# Patient Record
Sex: Male | Born: 1980
Health system: Southern US, Community
[De-identification: ages and names within clinical notes are randomized; demographics above are authoritative.]

## PROBLEM LIST (undated history)

## (undated) DIAGNOSIS — F17200 Nicotine dependence, unspecified, uncomplicated: Secondary | ICD-10-CM

## (undated) DIAGNOSIS — F1910 Other psychoactive substance abuse, uncomplicated: Secondary | ICD-10-CM

---

## 2014-10-04 ENCOUNTER — Emergency Department (HOSPITAL_COMMUNITY): Payer: Self-pay

## 2014-10-04 ENCOUNTER — Emergency Department (HOSPITAL_COMMUNITY)
Admission: EM | Admit: 2014-10-04 | Discharge: 2014-10-04 | Disposition: A | Discharge status: Home / Self Care | Payer: Self-pay | Attending: Emergency Medicine | Admitting: Emergency Medicine

## 2014-10-04 ENCOUNTER — Encounter (HOSPITAL_COMMUNITY): Payer: Self-pay | Admitting: Emergency Medicine

## 2014-10-04 DIAGNOSIS — Z720 Tobacco use: Secondary | ICD-10-CM | POA: Insufficient documentation

## 2014-10-04 DIAGNOSIS — M5136 Other intervertebral disc degeneration, lumbar region: Secondary | ICD-10-CM | POA: Insufficient documentation

## 2014-10-04 DIAGNOSIS — R000 Tachycardia, unspecified: Secondary | ICD-10-CM | POA: Insufficient documentation

## 2014-10-04 DIAGNOSIS — R112 Nausea with vomiting, unspecified: Secondary | ICD-10-CM | POA: Insufficient documentation

## 2014-10-04 DIAGNOSIS — L739 Follicular disorder, unspecified: Secondary | ICD-10-CM | POA: Insufficient documentation

## 2014-10-04 DIAGNOSIS — M545 Low back pain, unspecified: Secondary | ICD-10-CM

## 2014-10-04 DIAGNOSIS — R509 Fever, unspecified: Secondary | ICD-10-CM | POA: Insufficient documentation

## 2014-10-04 LAB — CBC WITH DIFFERENTIAL/PLATELET
Basophils Absolute: 0 10*3/uL (ref 0.0–0.1)
Basophils Relative: 0 % (ref 0–1)
Eosinophils Absolute: 0.3 10*3/uL (ref 0.0–0.7)
Eosinophils Relative: 5 % (ref 0–5)
HCT: 41.2 % (ref 39.0–52.0)
Hemoglobin: 14.5 g/dL (ref 13.0–17.0)
Lymphocytes Relative: 17 % (ref 12–46)
Lymphs Abs: 0.8 10*3/uL (ref 0.7–4.0)
MCH: 30.5 pg (ref 26.0–34.0)
MCHC: 35.2 g/dL (ref 30.0–36.0)
MCV: 86.7 fL (ref 78.0–100.0)
Monocytes Absolute: 0.1 10*3/uL (ref 0.1–1.0)
Monocytes Relative: 2 % — ABNORMAL LOW (ref 3–12)
Neutro Abs: 3.7 10*3/uL (ref 1.7–7.7)
Neutrophils Relative %: 76 % (ref 43–77)
Platelets: 160 10*3/uL (ref 150–400)
RBC: 4.75 MIL/uL (ref 4.22–5.81)
RDW: 12.5 % (ref 11.5–15.5)
WBC: 5 10*3/uL (ref 4.0–10.5)

## 2014-10-04 LAB — URINALYSIS, ROUTINE W REFLEX MICROSCOPIC
Bilirubin Urine: NEGATIVE
Glucose, UA: NEGATIVE mg/dL
Hgb urine dipstick: NEGATIVE
Ketones, ur: NEGATIVE mg/dL
Leukocytes, UA: NEGATIVE
Nitrite: NEGATIVE
Protein, ur: NEGATIVE mg/dL
Specific Gravity, Urine: 1.016 (ref 1.005–1.030)
Urobilinogen, UA: 0.2 mg/dL (ref 0.0–1.0)
pH: 7 (ref 5.0–8.0)

## 2014-10-04 LAB — COMPREHENSIVE METABOLIC PANEL
ALT: 98 U/L — ABNORMAL HIGH (ref 17–63)
AST: 33 U/L (ref 15–41)
Albumin: 4.7 g/dL (ref 3.5–5.0)
Alkaline Phosphatase: 60 U/L (ref 38–126)
Anion gap: 11 (ref 5–15)
BUN: 13 mg/dL (ref 6–20)
CO2: 25 mmol/L (ref 22–32)
Calcium: 9.2 mg/dL (ref 8.9–10.3)
Chloride: 100 mmol/L — ABNORMAL LOW (ref 101–111)
Creatinine, Ser: 0.78 mg/dL (ref 0.61–1.24)
GFR calc Af Amer: >60 mL/min (ref >60)
GFR calc non Af Amer: >60 mL/min (ref >60)
Glucose, Bld: 131 mg/dL — ABNORMAL HIGH (ref 65–99)
Potassium: 3.8 mmol/L (ref 3.5–5.1)
Sodium: 136 mmol/L (ref 135–145)
Total Bilirubin: 1.3 mg/dL — ABNORMAL HIGH (ref 0.3–1.2)
Total Protein: 7.7 g/dL (ref 6.5–8.1)

## 2014-10-04 LAB — C-REACTIVE PROTEIN: CRP: 2.4 mg/dL — ABNORMAL HIGH (ref <1.0)

## 2014-10-04 LAB — LACTIC ACID, PLASMA: Lactic Acid, Venous: 1 mmol/L (ref 0.5–2.0)

## 2014-10-04 LAB — SEDIMENTATION RATE: Sed Rate: 10 mm/hr (ref 0–16)

## 2014-10-04 MED ORDER — MORPHINE SULFATE 4 MG/ML IJ SOLN
6.0000 mg | Freq: Once | INTRAMUSCULAR | Status: AC
  Administered 2014-10-04: 6 mg via INTRAVENOUS
  Filled 2014-10-04: qty 2

## 2014-10-04 MED ORDER — SODIUM CHLORIDE 0.9 % IV BOLUS (SEPSIS)
1000.0000 mL | Freq: Once | INTRAVENOUS | Status: AC
Start: 2014-10-04 — End: 2014-10-04
  Administered 2014-10-04: 1000 mL via INTRAVENOUS

## 2014-10-04 MED ORDER — IOHEXOL 300 MG/ML  SOLN
100.0000 mL | Freq: Once | INTRAMUSCULAR | Status: AC | PRN
  Administered 2014-10-04: 100 mL via INTRAVENOUS

## 2014-10-04 MED ORDER — IOHEXOL 300 MG/ML  SOLN
25.0000 mL | Freq: Once | INTRAMUSCULAR | Status: AC | PRN
  Administered 2014-10-04: 25 mL via ORAL

## 2014-10-04 MED ORDER — SODIUM CHLORIDE 0.9 % IV BOLUS (SEPSIS)
1000.0000 mL | Freq: Once | INTRAVENOUS | Status: AC
  Administered 2014-10-04: 1000 mL via INTRAVENOUS

## 2014-10-04 MED ORDER — GADOBENATE DIMEGLUMINE 529 MG/ML IV SOLN
20.0000 mL | Freq: Once | INTRAVENOUS | Status: AC | PRN
  Administered 2014-10-04: 18 mL via INTRAVENOUS

## 2014-10-04 MED ORDER — ACETAMINOPHEN 325 MG PO TABS
650.0000 mg | ORAL_TABLET | Freq: Once | ORAL | Status: AC
  Administered 2014-10-04: 650 mg via ORAL
  Filled 2014-10-04: qty 2

## 2014-10-04 MED ORDER — ONDANSETRON HCL 4 MG/2ML IJ SOLN
4.0000 mg | Freq: Once | INTRAMUSCULAR | Status: AC
  Administered 2014-10-04: 4 mg via INTRAVENOUS
  Filled 2014-10-04: qty 2

## 2014-10-04 NOTE — ED Notes (Signed)
EKG given to EDP,Kohut,MD., for review. 

## 2014-10-04 NOTE — ED Notes (Signed)
Pt presents POV for lower back pain, abdominal pain, and emesis since 1100 this afternoon.

## 2014-10-04 NOTE — ED Notes (Signed)
Patient transported to MRI 

## 2014-10-04 NOTE — ED Provider Notes (Signed)
Pt signed out by Dr Juleen ChinaKohut at 0730 that mri is pending, and if neg for acute infectious process, to d/c to home.  MRI results noted. Discussed w pt.  Pt with ddd, but no evidence infection.   Pt states he has had similar lower back pain in past. He has no radicular pain. Low back pain currently is much improved/resolved.   Spine non tender. Back exam otherwise unremarkable as well.   Pt denies fever, chills or sweats (intial temp was 100.7).  Pt states he does not feel ill or sick in any way. No headaches. No neck pain or stiffness. No cough or uri c/o. No chest pain or sob. No abd pain. abd soft nt. No nvd. No dysuria or gu c/o. No rash.   Pt tolerating po fluids. Is ambulatory about the ED without any faintness or dizziness. When pt resting/sleeping, several low bps noted during early AM hours, earlier today - pt denies at any point feel faint, or ill.  Lactate normal.   Manual bps rechecked prior to d/c this AM, and in range 96-100/55-60.   Hr has remained in 60's. Pulse ox 99%. rr 14 w no increased wob.  Pt remains asymptomatic, and feels well.  Pt currently appears stable for d/c.  Return precautions discussed.    Cathren LaineKevin Karam Dunson, MD 10/04/14 [id number]

## 2014-10-04 NOTE — ED Notes (Signed)
Pt informed that a urine specimen is needed. 

## 2014-10-04 NOTE — ED Notes (Signed)
MD made aware of pt's low BP and HR.

## 2014-10-04 NOTE — ED Provider Notes (Signed)
CSN: [id number]     Arrival date & time 10/04/14  0043 History   First MD Initiated Contact with Patient 10/04/14 0057     Chief Complaint  Patient presents with  . Back Pain  . Nausea  . Emesis     (Consider location/radiation/quality/duration/timing/severity/associated sxs/prior Treatment) HPI   34 year old male with acute onset of midline lumbar pain while at rest. Symptom onset around 2200 yesterday. Persistent since then. Patient describes the pain as sharp and severe. Worse with movement. He felt fine yesterday up until the onset of symptoms. No urinary complaints. No abdominal pain. No rectal pain. Nausea and vomited prior to arrival. No diarrhea. Denies any history of significant lower back pain. Denies any previous back surgeries. Denies history of IV drug use. Fever and has felt chills.  History reviewed. No pertinent past medical history. History reviewed. No pertinent past surgical history. No family history on file. History  Substance Use Topics  . Smoking status: Current Every Day Smoker -- 1.00 packs/day    Types: Cigarettes  . Smokeless tobacco: Not on file  . Alcohol Use: Not on file    Review of Systems  All systems reviewed and negative, other than as noted in HPI.   Allergies  Review of patient's allergies indicates not on file.  Home Medications   Prior to Admission medications   Not on File   BP 101/57 mmHg  Pulse 144  Temp(Src) 100.7 F (38.2 C) (Oral)  Resp 21  SpO2 94% Physical Exam  Constitutional: He appears well-developed and well-nourished.  Laying in bed. Appears uncomfortable, but not toxic.  HENT:  Head: Normocephalic and atraumatic.  Eyes: Conjunctivae are normal. Right eye exhibits no discharge. Left eye exhibits no discharge.  Neck: Neck supple.  Cardiovascular: Regular rhythm and normal heart sounds.  Exam reveals no gallop and no friction rub.   No murmur heard. Tachycardic  Pulmonary/Chest: Effort normal and breath sounds  normal. No respiratory distress.  Abdominal: Soft. He exhibits no distension. There is no tenderness.  Genitourinary:  DRE: normal tone. Prostate does not feel significantly enlarged nor tender  Musculoskeletal: He exhibits no edema or tenderness.  Scattered folliculitis to the back, but no concerning lesions noted otherwise. Patient points to midline mid to lower lumbar region as area of maximal pain. Not reproducible with palpation.  Neurological: He is alert.  Strength 5/5 b/l LE. Sensation intact to light touch.   Skin: Skin is warm and dry.  Psychiatric: He has a normal mood and affect. His behavior is normal. Thought content normal.  Nursing note and vitals reviewed.   ED Course  Procedures (including critical care time) Labs Review Labs Reviewed  CBC WITH DIFFERENTIAL/PLATELET - Abnormal; Notable for the following:    Monocytes Relative 2 (*)    All other components within normal limits  COMPREHENSIVE METABOLIC PANEL - Abnormal; Notable for the following:    Chloride 100 (*)    Glucose, Bld 131 (*)    ALT 98 (*)    Total Bilirubin 1.3 (*)    All other components within normal limits  CULTURE, BLOOD (ROUTINE X 2)  CULTURE, BLOOD (ROUTINE X 2)  LACTIC ACID, PLASMA  URINALYSIS, ROUTINE W REFLEX MICROSCOPIC (NOT AT [facility])  SEDIMENTATION RATE  C-REACTIVE PROTEIN    Imaging Review Dg Chest 2 View  10/04/2014   CLINICAL DATA:  Fever and vomiting for 24 hours  EXAM: CHEST  2 VIEW  COMPARISON:  None.  FINDINGS: The heart size and mediastinal contours  are within normal limits. Both lungs are clear. The visualized skeletal structures are unremarkable.  IMPRESSION: No active cardiopulmonary disease.   Electronically Signed   By: Ellery Plunk M.D.   On: 10/04/2014 03:33   Ct Abdomen Pelvis W Contrast  10/04/2014   CLINICAL DATA:  Lower back pain, abdominal pain and emesis since 11:00.  EXAM: CT ABDOMEN AND PELVIS WITH CONTRAST  TECHNIQUE: Multidetector CT imaging of the abdomen  and pelvis was performed using the standard protocol following bolus administration of intravenous contrast.  CONTRAST:  OMNIPAQUE IOHEXOL 300 MG/ML  SOLN  COMPARISON:  None.  FINDINGS: There are normal appearances of the liver, spleen, pancreas, adrenals and kidneys except for a 9 mm presumed cysts in the lower pole right kidney.  Bowel and mesentery appear unremarkable.  No acute inflammatory changes are evident in the abdomen or pelvis. There is no ascites.  There is no significant abnormality in the lower chest.  There is no significant musculoskeletal abnormality. Incidental note is made of a small fat containing umbilical hernia.  IMPRESSION: No significant abnormality.  Small fat containing umbilical hernia.   Electronically Signed   By: Ellery Plunk M.D.   On: 10/04/2014 03:03     EKG Interpretation   Date/Time:  Thursday October 04 2014 00:59:15 EDT Ventricular Rate:  139 PR Interval:  154 QRS Duration: 95 QT Interval:  275 QTC Calculation: 418 R Axis:   96 Text Interpretation:  Sinus tachycardia anterior STE, likely early  repolarization No old tracing to compare Confirmed by Jameelah Watts  MD, Abdoulie Tierce  ([id number]) on 10/04/2014 1:02:27 AM      MDM   Final diagnoses:  Fever  Lower back pain    33yM with acute onset of severe midline lumbar pain. Febrile. No neuro complaints. Nonfocal neuro exam. No urinary complaints. No rectal pain. No abdominal pain.   Lower than typically see with pyelonephritis. No CVA tenderness. Consider SEA or vertebral osteomyelitis/discitis. Pretty acute onset though. No obvious risk factors that I could identify. Otherwise fairly healthy. No previous back surgeries/procedures. Denies hx of IVDU.  Consider prostatitis. No rectal pain. Rectal exam unremarkable. Unfortunately no MRI available at this time. Will start with CT. May need MRI if no obvious explanation for his symptoms.   Work-up pretty unremarkable at this point. Looks significantly better. HR  now 50-60s from 140s on arrival. Still having lower back pain but much improved. Hypotension now but normal HR. No new complaints. Denies dizziness/lightheadedness/SOB. Lactic acid normal. Sed Rate came back normal. Since he is still having lower back pan with documented fever w/o reasonable alternative explanation, I feel he still need MR. I feel appropriate for discharge if MRI doesn't reveal concerning pathology.   Raeford Razor, MD 10/04/14 [phone]

## 2014-10-04 NOTE — ED Notes (Signed)
Pt ambulated in the hallway without complaint of dizziness or lightheadedness.  MD made aware.

## 2014-10-04 NOTE — ED Notes (Signed)
MRI will be over to get pt in @ 45 min. Pt states he is not claustrophobic, no body piercing, only metal in body is in fillings. Pt is A & O. NAD, denies pain at this time

## 2014-10-04 NOTE — Discharge Instructions (Signed)
It was our pleasure to provide your ER care today - we hope that you feel better.  Rest. Drink adequate fluids.    Your MRI was read as follows: IMPRESSION: 1. 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 2. Right paramedian disc protrusion with moderate right and mild left subarticular stenosis at L4-5. 3. Mild right foraminal narrowing at L4-5.   Avoid bending at waist, or heavy lifting > 20 lbs for the next few days.  Take motrin or aleve as need for pain.  Follow up with primary care doctor in the coming week for recheck if symptoms fail to improve/resolve.  Return to ER right away if feeling worse, weak/faint, high fevers, trouble breathing, severe abdominal pain, other new symptoms, medical emergency, other concern.     Fever, Adult A fever is a higher than normal body temperature. In an adult, an oral temperature around 98.6 F (37 C) is considered normal. A temperature of 100.4 F (38 C) or higher is generally considered a fever. Mild or moderate fevers generally have no long-term effects and often do not require treatment. Extreme fever (greater than or equal to 106 F or 41.1 C) can cause seizures. The sweating that may occur with repeated or prolonged fever may cause dehydration. Elderly people can develop confusion during a fever. A measured temperature can vary with:  Age.  Time of day.  Method of measurement (mouth, underarm, rectal, or ear). The fever is confirmed by taking a temperature with a thermometer. Temperatures can be taken different ways. Some methods are accurate and some are not.  An oral temperature is used most commonly. Electronic thermometers are fast and accurate.  An ear temperature will only be accurate if the thermometer is positioned as recommended by the manufacturer.  A rectal temperature is accurate and done for those adults who have a condition where an oral temperature cannot be taken.  An  underarm (axillary) temperature is not accurate and not recommended. Fever is a symptom, not a disease.  CAUSES   Infections commonly cause fever.  Some noninfectious causes for fever include:  Some arthritis conditions.  Some thyroid or adrenal gland conditions.  Some immune system conditions.  Some types of cancer.  A medicine reaction.  High doses of certain street drugs such as methamphetamine.  Dehydration.  Exposure to high outside or room temperatures.  Occasionally, the source of a fever cannot be determined. This is sometimes called a "fever of unknown origin" (FUO).  Some situations may lead to a temporary rise in body temperature that may go away on its own. Examples are:  Childbirth.  Surgery.  Intense exercise. HOME CARE INSTRUCTIONS   Take appropriate medicines for fever. Follow dosing instructions carefully. If you use acetaminophen to reduce the fever, be careful to avoid taking other medicines that also contain acetaminophen. Do not take aspirin for a fever if you are younger than age 51. There is an association with Reye's syndrome. Reye's syndrome is a rare but potentially deadly disease.  If an infection is present and antibiotics have been prescribed, take them as directed. Finish them even if you start to feel better.  Rest as needed.  Maintain an adequate fluid intake. To prevent dehydration during an illness with prolonged or recurrent fever, you may need to drink extra fluid.Drink enough fluids to keep your urine clear or pale yellow.  Sponging or bathing with room temperature water may help reduce body temperature. Do not use ice water or alcohol sponge  baths.  Dress comfortably, but do not over-bundle. SEEK MEDICAL CARE IF:   You are unable to keep fluids down.  You develop vomiting or diarrhea.  You are not feeling at least partly better after 3 days.  You develop new symptoms or problems. SEEK IMMEDIATE MEDICAL CARE IF:   You have  shortness of breath or trouble breathing.  You develop excessive weakness.  You are dizzy or you faint.  You are extremely thirsty or you are making little or no urine.  You develop new pain that was not there before (such as in the head, neck, chest, back, or abdomen).  You have persistent vomiting and diarrhea for more than 1 to 2 days.  You develop a stiff neck or your eyes become sensitive to light.  You develop a skin rash.  You have a fever or persistent symptoms for more than 2 to 3 days.  You have a fever and your symptoms suddenly get worse. MAKE SURE YOU:   Understand these instructions.  Will watch your condition.  Will get help right away if you are not doing well or get worse. Document Released: 09/16/2000 Document Revised: 08/07/2013 Document Reviewed: 01/22/2011 [facility] Patient Information 2015 [place], [place]. This information is not intended to replace advice given to you by your health care provider. Make sure you discuss any questions you have with your health care provider.    Degenerative Disk Disease Degenerative disk disease is a condition caused by the changes that occur in the cushions of the backbone (spinal disks) as you grow older. Spinal disks are soft and compressible disks located between the bones of the spine (vertebrae). They act like shock absorbers. Degenerative disk disease can affect the whole spine. However, the neck and lower back are most commonly affected. Many changes can occur in the spinal disks with aging, such as:  The spinal disks may dry and shrink.  Small tears may occur in the tough, outer covering of the disk (annulus).  The disk space may become smaller due to loss of water.  Abnormal growths in the bone (spurs) may occur. This can put pressure on the nerve roots exiting the spinal canal, causing pain.  The spinal canal may become narrowed. CAUSES  Degenerative disk disease is a condition caused by the changes that  occur in the spinal disks with aging. The exact cause is not known, but there is a genetic basis for many patients. Degenerative changes can occur due to loss of fluid in the disk. This makes the disk thinner and reduces the space between the backbones. Small cracks can develop in the outer layer of the disk. This can lead to the breakdown of the disk. You are more likely to get degenerative disk disease if you are overweight. Smoking cigarettes and doing heavy work such as weightlifting can also increase your risk of this condition. Degenerative changes can start after a sudden injury. Growth of bone spurs can compress the nerve roots and cause pain.  SYMPTOMS  The symptoms vary from person to person. Some people may have no pain, while others have severe pain. The pain may be so severe that it can limit your activities. The location of the pain depends on the part of your backbone that is affected. You will have neck or arm pain if a disk in the neck area is affected. You will have pain in your back, buttocks, or legs if a disk in the lower back is affected. The pain becomes worse while  bending, reaching up, or with twisting movements. The pain may start gradually and then get worse as time passes. It may also start after a major or minor injury. You may feel numbness or tingling in the arms or legs.  DIAGNOSIS  Your caregiver will ask you about your symptoms and about activities or habits that may cause the pain. He or she may also ask about any injuries, diseases, or treatments you have had earlier. Your caregiver will examine you to check for the range of movement that is possible in the affected area, to check for strength in your extremities, and to check for sensation in the areas of the arms and legs supplied by different nerve roots. An X-ray of the spine may be taken. Your caregiver may suggest other imaging tests, such as magnetic resonance imaging (MRI), if needed.  TREATMENT  Treatment includes  rest, modifying your activities, and applying ice and heat. Your caregiver may prescribe medicines to reduce your pain and may ask you to do some exercises to strengthen your back. In some cases, you may need surgery. You and your caregiver will decide on the treatment that is best for you. HOME CARE INSTRUCTIONS   Follow proper lifting and walking techniques as advised by your caregiver.  Maintain good posture.  Exercise regularly as advised.  Perform relaxation exercises.  Change your sitting, standing, and sleeping habits as advised. Change positions frequently.  Lose weight as advised.  Stop smoking if you smoke.  Wear supportive footwear. SEEK MEDICAL CARE IF:  Your pain does not go away within 1 to 4 weeks. SEEK IMMEDIATE MEDICAL CARE IF:   Your pain is severe.  You notice weakness in your arms, hands, or legs.  You begin to lose control of your bladder or bowel movements. MAKE SURE YOU:   Understand these instructions.  Will watch your condition.  Will get help right away if you are not doing well or get worse. Document Released: 01/18/2007 Document Revised: 06/15/2011 Document Reviewed: 07/25/2013 [facility] Patient Information 2015 [place], [place]. This information is not intended to replace advice given to you by your health care provider. Make sure you discuss any questions you have with your health care provider.    Back Pain, Adult Low back pain is very common. About 1 in 5 people have back pain.The cause of low back pain is rarely dangerous. The pain often gets better over time.About half of people with a sudden onset of back pain feel better in just 2 weeks. About 8 in 10 people feel better by 6 weeks.  CAUSES Some common causes of back pain include:  Strain of the muscles or ligaments supporting the spine.  Wear and tear (degeneration) of the spinal discs.  Arthritis.  Direct injury to the back. DIAGNOSIS Most of the time, the direct cause of low  back pain is not known.However, back pain can be treated effectively even when the exact cause of the pain is unknown.Answering your caregiver's questions about your overall health and symptoms is one of the most accurate ways to make sure the cause of your pain is not dangerous. If your caregiver needs more information, he or she may order lab work or imaging tests (X-rays or MRIs).However, even if imaging tests show changes in your back, this usually does not require surgery. HOME CARE INSTRUCTIONS For many people, back pain returns.Since low back pain is rarely dangerous, it is often a condition that people can learn to [facility] their own.   Remain  active. It is stressful on the back to sit or stand in one place. Do not sit, drive, or stand in one place for more than 30 minutes at a time. Take short walks on level surfaces as soon as pain allows.Try to increase the length of time you walk each day.  Do not stay in bed.Resting more than 1 or 2 days can delay your recovery.  Do not avoid exercise or work.Your body is made to move.It is not dangerous to be active, even though your back may hurt.Your back will likely heal faster if you return to being active before your pain is gone.  Pay attention to your body when you bend and lift. Many people have less discomfortwhen lifting if they bend their knees, keep the load close to their bodies,and avoid twisting. Often, the most comfortable positions are those that put less stress on your recovering back.  Find a comfortable position to sleep. Use a firm mattress and lie on your side with your knees slightly bent. If you lie on your back, put a pillow under your knees.  Only take over-the-counter or prescription medicines as directed by your caregiver. Over-the-counter medicines to reduce pain and inflammation are often the most helpful.Your caregiver may prescribe muscle relaxant drugs.These medicines help dull your pain so you can more  quickly return to your normal activities and healthy exercise.  Put ice on the injured area.  Put ice in a plastic bag.  Place a towel between your skin and the bag.  Leave the ice on for 15-20 minutes, 03-04 times a day for the first 2 to 3 days. After that, ice and heat may be alternated to reduce pain and spasms.  Ask your caregiver about trying back exercises and gentle massage. This may be of some benefit.  Avoid feeling anxious or stressed.Stress increases muscle tension and can worsen back pain.It is important to recognize when you are anxious or stressed and learn ways to manage it.Exercise is a great option. SEEK MEDICAL CARE IF:  You have pain that is not relieved with rest or medicine.  You have pain that does not improve in 1 week.  You have new symptoms.  You are generally not feeling well. SEEK IMMEDIATE MEDICAL CARE IF:   You have pain that radiates from your back into your legs.  You develop new bowel or bladder control problems.  You have unusual weakness or numbness in your arms or legs.  You develop nausea or vomiting.  You develop abdominal pain.  You feel faint. Document Released: 03/23/2005 Document Revised: 09/22/2011 Document Reviewed: 07/25/2013 [facility] Patient Information 2015 [place], [place]. This information is not intended to replace advice given to you by your health care provider. Make sure you discuss any questions you have with your health care provider.

## 2014-10-09 LAB — CULTURE, BLOOD (ROUTINE X 2)
Culture: NO GROWTH
Culture: NO GROWTH

## 2016-01-02 ENCOUNTER — Emergency Department (HOSPITAL_COMMUNITY): Payer: Self-pay

## 2016-01-02 ENCOUNTER — Inpatient Hospital Stay (HOSPITAL_COMMUNITY)
Admission: EM | Admit: 2016-01-02 | Discharge: 2016-01-06 | DRG: 101 | Disposition: A | Discharge status: Home / Self Care | Payer: Self-pay | Attending: Internal Medicine | Admitting: Internal Medicine

## 2016-01-02 ENCOUNTER — Encounter (HOSPITAL_COMMUNITY): Payer: Self-pay | Admitting: Emergency Medicine

## 2016-01-02 DIAGNOSIS — R748 Abnormal levels of other serum enzymes: Secondary | ICD-10-CM | POA: Diagnosis present

## 2016-01-02 DIAGNOSIS — R740 Nonspecific elevation of levels of transaminase and lactic acid dehydrogenase [LDH]: Secondary | ICD-10-CM | POA: Diagnosis present

## 2016-01-02 DIAGNOSIS — D72829 Elevated white blood cell count, unspecified: Secondary | ICD-10-CM | POA: Diagnosis present

## 2016-01-02 DIAGNOSIS — R945 Abnormal results of liver function studies: Secondary | ICD-10-CM

## 2016-01-02 DIAGNOSIS — F1910 Other psychoactive substance abuse, uncomplicated: Secondary | ICD-10-CM | POA: Diagnosis present

## 2016-01-02 DIAGNOSIS — G9340 Encephalopathy, unspecified: Secondary | ICD-10-CM

## 2016-01-02 DIAGNOSIS — R569 Unspecified convulsions: Principal | ICD-10-CM | POA: Diagnosis present

## 2016-01-02 DIAGNOSIS — F1410 Cocaine abuse, uncomplicated: Secondary | ICD-10-CM | POA: Diagnosis present

## 2016-01-02 DIAGNOSIS — R411 Anterograde amnesia: Secondary | ICD-10-CM | POA: Diagnosis present

## 2016-01-02 DIAGNOSIS — R404 Transient alteration of awareness: Secondary | ICD-10-CM

## 2016-01-02 DIAGNOSIS — F17200 Nicotine dependence, unspecified, uncomplicated: Secondary | ICD-10-CM | POA: Diagnosis present

## 2016-01-02 DIAGNOSIS — R413 Other amnesia: Secondary | ICD-10-CM | POA: Diagnosis present

## 2016-01-02 DIAGNOSIS — R7989 Other specified abnormal findings of blood chemistry: Secondary | ICD-10-CM

## 2016-01-02 DIAGNOSIS — F1110 Opioid abuse, uncomplicated: Secondary | ICD-10-CM | POA: Diagnosis present

## 2016-01-02 DIAGNOSIS — F17210 Nicotine dependence, cigarettes, uncomplicated: Secondary | ICD-10-CM | POA: Diagnosis present

## 2016-01-02 HISTORY — DX: Other psychoactive substance abuse, uncomplicated: F19.10

## 2016-01-02 HISTORY — DX: Nicotine dependence, unspecified, uncomplicated: F17.200

## 2016-01-02 LAB — PROTIME-INR
INR: 1.03
PROTHROMBIN TIME: 13.5 s (ref 11.4–15.2)

## 2016-01-02 LAB — URINALYSIS, ROUTINE W REFLEX MICROSCOPIC
BILIRUBIN URINE: NEGATIVE
Glucose, UA: 250 mg/dL — AB
Hgb urine dipstick: NEGATIVE
Ketones, ur: NEGATIVE mg/dL
LEUKOCYTES UA: NEGATIVE
Nitrite: NEGATIVE
PH: 6 (ref 5.0–8.0)
Protein, ur: 30 mg/dL — AB
Specific Gravity, Urine: 1.02 (ref 1.005–1.030)

## 2016-01-02 LAB — CBC WITH DIFFERENTIAL/PLATELET
BASOS PCT: 0 %
Basophils Absolute: 0 10*3/uL (ref 0.0–0.1)
EOS ABS: 0 10*3/uL (ref 0.0–0.7)
Eosinophils Relative: 0 %
HCT: 38.9 % — ABNORMAL LOW (ref 39.0–52.0)
Hemoglobin: 13.7 g/dL (ref 13.0–17.0)
Lymphocytes Relative: 17 %
Lymphs Abs: 2.2 10*3/uL (ref 0.7–4.0)
MCH: 32 pg (ref 26.0–34.0)
MCHC: 35.2 g/dL (ref 30.0–36.0)
MCV: 90.9 fL (ref 78.0–100.0)
MONO ABS: 1 10*3/uL (ref 0.1–1.0)
Monocytes Relative: 8 %
Neutro Abs: 9.7 10*3/uL — ABNORMAL HIGH (ref 1.7–7.7)
Neutrophils Relative %: 75 %
PLATELETS: 236 10*3/uL (ref 150–400)
RBC: 4.28 MIL/uL (ref 4.22–5.81)
RDW: 12.7 % (ref 11.5–15.5)
WBC: 12.9 10*3/uL — ABNORMAL HIGH (ref 4.0–10.5)

## 2016-01-02 LAB — RAPID URINE DRUG SCREEN, HOSP PERFORMED
Amphetamines: NOT DETECTED
BARBITURATES: NOT DETECTED
BENZODIAZEPINES: NOT DETECTED
Cocaine: NOT DETECTED
Opiates: POSITIVE — AB
Tetrahydrocannabinol: NOT DETECTED

## 2016-01-02 LAB — COMPREHENSIVE METABOLIC PANEL
ALK PHOS: 85 U/L (ref 38–126)
ALT: 415 U/L — AB (ref 17–63)
ANION GAP: 9 (ref 5–15)
AST: 151 U/L — ABNORMAL HIGH (ref 15–41)
Albumin: 4.6 g/dL (ref 3.5–5.0)
BILIRUBIN TOTAL: 0.9 mg/dL (ref 0.3–1.2)
BUN: 13 mg/dL (ref 6–20)
CALCIUM: 9.3 mg/dL (ref 8.9–10.3)
CO2: 27 mmol/L (ref 22–32)
CREATININE: 0.77 mg/dL (ref 0.61–1.24)
Chloride: 102 mmol/L (ref 101–111)
GFR calc Af Amer: >60 mL/min (ref >60)
GFR calc non Af Amer: >60 mL/min (ref >60)
GLUCOSE: 112 mg/dL — AB (ref 65–99)
Potassium: 3.6 mmol/L (ref 3.5–5.1)
SODIUM: 138 mmol/L (ref 135–145)
TOTAL PROTEIN: 8.2 g/dL — AB (ref 6.5–8.1)

## 2016-01-02 LAB — URINE MICROSCOPIC-ADD ON: RBC / HPF: NONE SEEN RBC/hpf (ref 0–5)

## 2016-01-02 LAB — ETHANOL

## 2016-01-02 LAB — I-STAT CG4 LACTIC ACID, ED: Lactic Acid, Venous: 1.34 mmol/L (ref 0.5–1.9)

## 2016-01-02 LAB — CBG MONITORING, ED: Glucose-Capillary: 127 mg/dL — ABNORMAL HIGH (ref 65–99)

## 2016-01-02 MED ORDER — IBUPROFEN 200 MG PO TABS: 400.0000 mg | ORAL_TABLET | Freq: Four times a day (QID) | ORAL | Status: DC | PRN

## 2016-01-02 MED ORDER — ZOLPIDEM TARTRATE 5 MG PO TABS
5.0000 mg | ORAL_TABLET | Freq: Every evening | ORAL | Status: DC | PRN
Start: 2016-01-02 — End: 2016-01-06

## 2016-01-02 MED ORDER — LEVETIRACETAM 500 MG PO TABS
500.0000 mg | ORAL_TABLET | Freq: Once | ORAL | Status: AC
  Administered 2016-01-02: 500 mg via ORAL
  Filled 2016-01-02: qty 1

## 2016-01-02 MED ORDER — ENOXAPARIN SODIUM 40 MG/0.4ML ~~LOC~~ SOLN
40.0000 mg | SUBCUTANEOUS | Status: DC
  Administered 2016-01-03 – 2016-01-06 (×3): 40 mg via SUBCUTANEOUS
  Filled 2016-01-02 (×3): qty 0.4

## 2016-01-02 MED ORDER — NICOTINE 14 MG/24HR TD PT24: 14.0000 mg | MEDICATED_PATCH | Freq: Every day | TRANSDERMAL | Status: DC | PRN

## 2016-01-02 MED ORDER — SODIUM CHLORIDE 0.9 % IV BOLUS (SEPSIS)
1000.0000 mL | Freq: Once | INTRAVENOUS | Status: AC
  Administered 2016-01-02: 1000 mL via INTRAVENOUS

## 2016-01-02 MED ORDER — DEXTROSE 5 % IV SOLN
730.0000 mg | Freq: Three times a day (TID) | INTRAVENOUS | Status: DC
  Administered 2016-01-02 – 2016-01-04 (×5): 730 mg via INTRAVENOUS
  Filled 2016-01-02 (×8): qty 14.6

## 2016-01-02 MED ORDER — SODIUM CHLORIDE 0.9 % IV SOLN
INTRAVENOUS | Status: DC
  Administered 2016-01-02 – 2016-01-04 (×3): via INTRAVENOUS

## 2016-01-02 MED ORDER — SODIUM CHLORIDE 0.9% FLUSH
3.0000 mL | Freq: Two times a day (BID) | INTRAVENOUS | Status: DC
  Administered 2016-01-02 – 2016-01-06 (×3): 3 mL via INTRAVENOUS

## 2016-01-02 NOTE — Progress Notes (Addendum)
Pharmacy Antibiotic Note  [age] YOM with history of tobacco use and polysubstance abuse presented to [facility] after an episode of LOC.  Patient was given Keppra for concern of seizure, then [facility].  Pharmacy consulted to initiate acyclovir for possible CNS involvement.  Baseline labs reviewed.   Plan: - Acyclovir 730mg  IV Q8H (~10 mg/kg IBW) - Monitor renal fxn, clinical progress   Height: 5\' 10"  (177.8 cm) Weight: 179 lb 3.2 oz (81.3 kg) IBW/kg (Calculated) : 73  Temp (24hrs), Avg:98.1 F (36.7 C), Min:97.8 F (36.6 C), Max:98.3 F (36.8 C)   Recent Labs Lab 01/02/16 1045 01/02/16 1100  WBC 12.9*  --   CREATININE 0.77  --   LATICACIDVEN  --  1.34    Estimated Creatinine Clearance: 134.3 mL/min (by C-G formula based on SCr of 0.77 mg/dL).    No Known Allergies  Antimicrobials this admission:  Acyclovir 9/28 >>  Dose adjustments this admission:  N/A  Microbiology results:  9/28 UCx -    Chelsea Aushuy D. Laney Potashang, PharmD, BCPS Pager:  [phone] 01/02/2016, 10:19 PM

## 2016-01-02 NOTE — ED Notes (Signed)
Nurse is going to collect labs when she starts the IV

## 2016-01-02 NOTE — ED Notes (Signed)
Pt in MRI.

## 2016-01-02 NOTE — ED Notes (Signed)
[facility] Report given to [facility], EMT and RN. Pt stable upon discharge.

## 2016-01-02 NOTE — Consult Note (Signed)
Neurology Consultation Reason for Consult: Abnormal MRI Referring Physician: Evelena Peat, D  CC: Altered Mental Status  History is obtained from:Patient, wife  HPI: Marco Lynch is a 35 y.o. male who was in his normal sate of health yesterday. Around 9:50 pm, he got into the shower and then his wife noticed that he was in there for a long time. She finally realized it had been about an hour, and then broke into shower and found him on the ground unresponsive. He had sonorous respirations. She had a neighbor pull him out of the shower, and he began breathing more easily enter respond a little bit better. This morning, he was not acting right, and insisted that he had not had his schedule changed (something he knew beforehand.)  When he was still not his normal self, she brought him into the emergency room where he was evaluated and found to be slightly confused. He is amnestic to the episode. MRI of the brain reveals bilateral medial temporal hyperintensity  Prior to the loss of consciousness episode, he was relatively normal though his wife does state that he seemed a little bit more irritable for about 2 weeks.  ROS: A 14 point ROS was performed and is negative except as noted in the HPI.  Past Medical History:  Diagnosis Date  . Current every day smoker   . Polysubstance abuse      History reviewed. No pertinent family history.   Social History:  reports that he has been smoking Cigarettes.  He has been smoking about 1.00 pack per day. He has never used smokeless tobacco. He reports that he drinks alcohol. His drug history is not on file.   Exam: Current vital signs: BP 108/69 (BP Location: Right Arm)   Pulse (!) 58   Temp 98.3 F (36.8 C) (Oral)   Resp 18   Ht 5\' 10"  (1.778 m)   Wt 81.3 kg (179 lb 3.2 oz)   SpO2 100%   BMI 25.71 kg/m  Vital signs in last 24 hours: Temp:  [97.8 F (36.6 C)-98.3 F (36.8 C)] 98.3 F (36.8 C) (09/28 2029) Pulse Rate:  [50-76] 58 (09/28  2029) Resp:  [14-21] 18 (09/28 2029) BP: (91-108)/(53-75) 108/69 (09/28 2029) SpO2:  [96 %-100 %] 100 % (09/28 2029) Weight:  [63.5 kg (140 lb)-81.3 kg (179 lb 3.2 oz)] 81.3 kg (179 lb 3.2 oz) (09/28 2029)   Physical Exam  Constitutional: Appears well-developed and well-nourished.  Psych: Affect appropriate to situation Eyes: No scleral injection HENT: No OP obstrucion Head: Normocephalic.  Cardiovascular: Normal rate and regular rhythm.  Respiratory: Effort normal and breath sounds normal to anterior ascultation GI: Soft.  No distension. There is no tenderness.  Skin: WDI  Neuro: Mental Status: Patient is awake, alert, oriented to person, place, month, year. He has difficulty with oral backwards "D R O L W" and # of quarters in $2.75(response was 9) Patient is able to give a clear and coherent history. No signs of aphasia or neglect Cranial Nerves: II: Visual Fields are full. Pupils are equal, round, and reactive to light. III,IV, VI: EOMI without ptosis or diploplia.  V: Facial sensation is symmetric to temperature VII: Facial movement is symmetric.  VIII: hearing is intact to voice X: Uvula elevates symmetrically XI: Shoulder shrug is symmetric. XII: tongue is midline without atrophy or fasciculations.  Motor: Tone is normal. Bulk is normal. 5/5 strength was present in all four extremities.  Sensory: Sensation is symmetric to light touch and temperature  in the arms and legs. Deep Tendon Reflexes: 2+ and symmetric in the biceps and patellae.  Plantars: Toes are downgoing bilaterally.  Cerebellar: FNF and HKS are intact bilaterally  I have reviewed labs in epic and the results pertinent to this consultation are: CMP-elevated liver enzymes  I have reviewed the images obtained: MRI brain-bilateral mesial temporal hyperintensity  Impression: 35 year old male with persistent altered mental status following an episode of collapse and unresponsiveness. Possibilities  include prolonged status epilepticus which was unwitnessed which self resolved, encephalitis, metabolic insult which is unclear at this time.  Recommendations: 1) B1, following lab will start thiamine 2) lumbar puncture with cells, protein, glucose, arbovirus, IgG index, HSV, lyme 3) start acyclovir given medial temporal abnormalities 4) ammonia 5) EEG 6) will follow.    Ritta SlotMcNeill Duayne Brideau, MD [facility] [phone]  If 7pm- 7am, please page neurology on call as listed in AMION.

## 2016-01-02 NOTE — H&P (Signed)
History and Physical  Marco Lynch ZOX:[id number] DOB: [birth year] DOA: 01/02/2016  Referring physician: Jeraldine Loots, MD PCP: No primary care provider on file.   Chief Complaint: loss of consciousness  HPI: Marco Lynch is a 35 y.o. male smoker with history of a cane and heroin use reports that he has been generally healthy but did to the emergency department today because he reportedly passed out while he was in the shower but does not recall what really happened. He does report that he had a mild headache he does not recall getting up and getting dressed or arriving at the hospital. He has been confused about the day of the week. He does not have a history of epilepsy but there was some concern he may have had a seizure. He had a normal CT of the brain. However, he had an MRI of the brain that was concerning for possible seizure. Neurology was consult treated and they requested to evaluate the patient at [facility] The patient denied having chest pain nausea vomiting shortness of breath. He had an essentially normal neurological exam. He was given a dose of Keppra in the emergency department. The patient denies any recent recreational drug use. He denies alcohol use.  Review of Systems: All systems reviewed and apart from history of presenting illness, are negative.  Past Medical History:  Diagnosis Date  . Current every day smoker   . Polysubstance abuse    History reviewed. No pertinent surgical history. Social History:  reports that he has been smoking Cigarettes.  He has been smoking about 1.00 pack per day. He has never used smokeless tobacco. He reports that he drinks alcohol. His drug history is not on file.   No Known Allergies  History reviewed. No pertinent family history.   Prior to Admission medications   Not on File   Physical Exam: Vitals:   01/02/16 1545 01/02/16 1600 01/02/16 1615 01/02/16 1630  BP: 102/66 99/69 101/64 99/68  Pulse:   (!) 51 (!) 55  Resp:    18 18  Temp:      TempSrc:      SpO2:   97% 98%  Weight:      Height:        Constitutional: He appears well-developed. No distress.  HENT: Head: Normocephalic and atraumatic.  Eyes: Conjunctivae and EOM are normal.  Neck: Unremarkable neck exam, full range of motion, no tenderness to palpation, no deformities  Cardiovascular: Normal rate and regular rhythm.   Pulmonary/Chest: Effort normal. No stridor. No respiratory distress.  Abdominal: He exhibits no distension.  Musculoskeletal: He exhibits no edema.  Neurological: He is alert. Patient is oriented to place, year, disoriented to day of the week. Patient moves all extremity spontaneously, has no discoordination, no strength deficiency, no face asymmetry. Skin: Skin is warm and dry.  Psychiatric: He is slowed and withdrawn. Cognition and memory are impaired.   Labs on Admission:  Basic Metabolic Panel:  Recent Labs Lab 01/02/16 1045  NA 138  K 3.6  CL 102  CO2 27  GLUCOSE 112*  BUN 13  CREATININE 0.77  CALCIUM 9.3   Liver Function Tests:  Recent Labs Lab 01/02/16 1045  AST 151*  ALT 415*  ALKPHOS 85  BILITOT 0.9  PROT 8.2*  ALBUMIN 4.6   No results for input(s): LIPASE, AMYLASE in the last 168 hours. No results for input(s): AMMONIA in the last 168 hours. CBC:  Recent Labs Lab 01/02/16 1045  WBC 12.9*  NEUTROABS 9.7*  HGB 13.7  HCT 38.9*  MCV 90.9  PLT 236   Cardiac Enzymes: No results for input(s): CKTOTAL, CKMB, CKMBINDEX, TROPONINI in the last 168 hours.  BNP (last 3 results) No results for input(s): PROBNP in the last 8760 hours. CBG:  Recent Labs Lab 01/02/16 0913  GLUCAP 127*    Radiological Exams on Admission: Dg Chest 2 View  Result Date: 01/02/2016 CLINICAL DATA:  Fall in shower this morning hitting head with altered level of consciousness. EXAM: CHEST  2 VIEW COMPARISON:  10/04/2014 FINDINGS: Lungs are adequately inflated without consolidation or effusion. No pneumothorax.  Cardiomediastinal silhouette is within normal. Remaining bones and soft tissues are within normal. IMPRESSION: No acute findings. Electronically Signed   By: Elberta Fortis M.D.   On: 01/02/2016 10:14   Ct Head Wo Contrast  Result Date: 01/02/2016 CLINICAL DATA:  35 year old male with loss of consciousness, with fall in the shower, and confusion since yesterday. EXAM: CT HEAD WITHOUT CONTRAST TECHNIQUE: Contiguous axial images were obtained from the base of the skull through the vertex without intravenous contrast. COMPARISON:  No priors. FINDINGS: Brain: No evidence of acute infarction, hemorrhage, hydrocephalus, extra-axial collection or mass lesion/mass effect. Vascular: No hyperdense vessel or unexpected calcification. Skull: Normal. Negative for fracture or focal lesion. Sinuses/Orbits: No acute finding. Other: None. IMPRESSION: 1. No acute intracranial abnormalities. 2. The appearance of the brain is normal. Electronically Signed   By: Trudie Reed M.D.   On: 01/02/2016 10:04   Mr Brain Wo Contrast (neuro Protocol)  Result Date: 01/02/2016 CLINICAL DATA:  Fall in shower. Altered mental status with transient global amnesia. CVA versus seizure. EXAM: MRI HEAD WITHOUT CONTRAST TECHNIQUE: Multiplanar, multiecho pulse sequences of the brain and surrounding structures were obtained without intravenous contrast. COMPARISON:  Head CT from earlier today FINDINGS: Brain: Both hippocampi by are mildly swollen and are T2/FLAIR hyperintense with DWI hyperintensity (likely combination of shine through and true restriction based on ADC map). This is consistent with seizure phenomenon. Encephalitis is not likely given the asymmetric limited appearance and clinical status. No other finding to explain a seizure focus. No asymmetric appearance to suggest a focal underlying mass. Diffusion changes in hippocampi are usually reversible in seizure; no suspected infarct. No hemorrhage, hydrocephalus, or shift. Vascular:  Normal flow voids. Skull and upper cervical spine: Normal marrow signal. Sinuses/Orbits: Negative. Other: None. IMPRESSION: Symmetric signal abnormality in the hippocampi consistent with seizure phenomenon. Elsewhere negative brain. Electronically Signed   By: Marnee Spring M.D.   On: 01/02/2016 15:02   EKG: Independently reviewed.  Assessment/Plan Principal Problem:   Transient amnesia Active Problems:   Cocaine abuse   Heroin abuse   Tobacco dependence   Question of Seizure   Elevated liver enzymes   Leukocytosis   Polysubstance abuse  1. Transient amnesia and confusion-this is concerning for possible seizure activity. The patient is going to be transferred to [facility] for neurologic evaluation by our inpatient neurology service. They are consulted and will see the patient when he arrives. He still remains confused. He was given Keppra and emergency department. There was also concern about transient global amnesia. Urine drug screen positive for opioids but no other recreational drugs. EEG ordered. 2. Elevated liver enzymes-patient reports no known history of abnormal liver enzymes. He is being hydrated with IV fluids. Recheck liver enzymes in the morning. I question if there was an unknown ingestion of something that patient is not admitting to. 3. Leukocytosis-no infection found, plan to  repeat CBC in the morning. 4. Tobacco abuse-will provide a nicotine patch while in the hospital.     DVT Prophylaxis: Enoxaparin Code Status: Full  Family Communication: Bedside  Disposition Plan: Home when medically stable   Time spent: 45 minutes  Standley Dakinslanford Takya Vandivier, MD [facility] Pager [phone]  If 7PM-7AM, please contact night-coverage [email or web address] Password [email or web address] 01/02/2016, 5:02 PM

## 2016-01-02 NOTE — Progress Notes (Signed)
Patient arrived around 2030 from [facility], confused and lethargic also drowsy, will continue to monitor.

## 2016-01-02 NOTE — ED Triage Notes (Signed)
Patient complains of head pain, score of 2, resulting from a fall in the shower that the patient does not recall. Patient last memory is of being in the shower; patient does not recall getting dressed, does remember the car ride to the hospital.  Patient is alert and oriented to person and place, does not recall day of the week. Patient denies nausea, vomiting, shortness of breath. Pupils are equal round and reactive to light at 2 mm.

## 2016-01-02 NOTE — ED Provider Notes (Signed)
[facility]-EMERGENCY DEPT Provider Note   CSN: [id number] Arrival date & time: 01/02/16  4098     History   Chief Complaint Chief Complaint  Patient presents with  . Loss of Consciousness    Patient does not recall falling; last thing remembered was being in the shower; patient cannot recall day of the week, alert to self and location.    HPI Marco Lynch is a 35 y.o. male.  HPI Patient presents after an unusual event. Patient self denies pain, confusion, but denies recall of events since yesterday. Per report, family members heard the patient fall, while in the shower yesterday. Reportedly the patient had no bleeding, no obvious trauma, but was slow to respond. Today family brings him here for evaluation. The patient has no recall of events prior to or following the event, cannot describe why he is here, but acknowledges that circumstances are unusual. He states that he is generally healthy, without chronic medical conditions. Patient smokes cigarettes, uses cocaine occasionally, heroin occasionally, drinks alcohol weekly. No heroin or cocaine use in the past few days. Patient specifically denies pain anywhere, including head, neck, chest. He also denies any weakness in any extremity. No recall of prior similar events.  Past Medical History:  Diagnosis Date  . Current every day smoker   . Polysubstance abuse     Patient Active Problem List   Diagnosis Date Noted  . Polysubstance abuse 01/02/2016  . Cocaine abuse 01/02/2016  . Heroin abuse 01/02/2016  . Tobacco dependence 01/02/2016  . Elevated liver enzymes 01/02/2016  . Transient amnesia 01/02/2016  . Question of Seizure 01/02/2016  . Leukocytosis 01/02/2016    History reviewed. No pertinent surgical history.     Home Medications    Prior to Admission medications   Not on File    Family History History reviewed. No pertinent family history.  Social History Social History  Substance Use Topics  . Smoking  status: Current Every Day Smoker    Packs/day: 1.00    Types: Cigarettes  . Smokeless tobacco: Never Used  . Alcohol use Yes     Comment: "not a big drinker" according to patient     Allergies   Review of patient's allergies indicates no known allergies.   Review of Systems Review of Systems  Constitutional:       Per HPI, otherwise negative  HENT:       Per HPI, otherwise negative  Respiratory:       Per HPI, otherwise negative  Cardiovascular:       Per HPI, otherwise negative  Gastrointestinal: Negative for vomiting.  Endocrine:       Negative aside from HPI  Genitourinary:       Neg aside from HPI   Musculoskeletal:       Per HPI, otherwise negative  Skin: Negative.   Neurological: Negative for syncope.       History of present illness     Physical Exam Updated Vital Signs BP 99/68   Pulse (!) 55   Temp 97.8 F (36.6 C) (Oral)   Resp 18   Ht 5\' 10"  (1.778 m)   Wt 140 lb (63.5 kg)   SpO2 98%   BMI 20.09 kg/m   Physical Exam  Constitutional: He appears well-developed. No distress.  HENT:  Head: Normocephalic and atraumatic.  Eyes: Conjunctivae and EOM are normal.  Neck:  Unremarkable neck exam, full range of motion, no tenderness to palpation, no deformities  Cardiovascular: Normal rate and regular rhythm.  Pulmonary/Chest: Effort normal. No stridor. No respiratory distress.  Abdominal: He exhibits no distension.  Musculoskeletal: He exhibits no edema.  Neurological: He is alert.  Patient is oriented to place, year, disoriented to day of the week. Patient moves all extremity spontaneously, has no discoordination, no strength deficiency, no face asymmetry.   Skin: Skin is warm and dry.  Psychiatric: He is slowed and withdrawn. Cognition and memory are impaired.  Nursing note and vitals reviewed.    ED Treatments / Results  Labs (all labs ordered are listed, but only abnormal results are displayed) Labs Reviewed  COMPREHENSIVE METABOLIC PANEL  - Abnormal; Notable for the following:       Result Value   Glucose, Bld 112 (*)    Total Protein 8.2 (*)    AST 151 (*)    ALT 415 (*)    All other components within normal limits  CBC WITH DIFFERENTIAL/PLATELET - Abnormal; Notable for the following:    WBC 12.9 (*)    HCT 38.9 (*)    Neutro Abs 9.7 (*)    All other components within normal limits  URINALYSIS, ROUTINE W REFLEX MICROSCOPIC (NOT AT [facility]) - Abnormal; Notable for the following:    Glucose, UA 250 (*)    Protein, ur 30 (*)    All other components within normal limits  URINE RAPID DRUG SCREEN, HOSP PERFORMED - Abnormal; Notable for the following:    Opiates POSITIVE (*)    All other components within normal limits  URINE MICROSCOPIC-ADD ON - Abnormal; Notable for the following:    Squamous Epithelial / LPF 0-5 (*)    Bacteria, UA FEW (*)    Casts GRANULAR CAST (*)    All other components within normal limits  CBG MONITORING, ED - Abnormal; Notable for the following:    Glucose-Capillary 127 (*)    All other components within normal limits  URINE CULTURE  PROTIME-INR  ETHANOL  I-STAT CG4 LACTIC ACID, ED    EKG  EKG Interpretation  Date/Time:  Thursday January 02 2016 09:11:25 EDT Ventricular Rate:  60 PR Interval:    QRS Duration: 105 QT Interval:  420 QTC Calculation: 420 R Axis:   83 Text Interpretation:  Sinus rhythm ST-t wave abnormality slower rate compared to prior. Artifact Abnormal ekg Confirmed by Gerhard Munch  MD [phone]) on 01/02/2016 9:25:04 AM       Radiology Dg Chest 2 View  Result Date: 01/02/2016 CLINICAL DATA:  Fall in shower this morning hitting head with altered level of consciousness. EXAM: CHEST  2 VIEW COMPARISON:  10/04/2014 FINDINGS: Lungs are adequately inflated without consolidation or effusion. No pneumothorax. Cardiomediastinal silhouette is within normal. Remaining bones and soft tissues are within normal. IMPRESSION: No acute findings. Electronically Signed   By: Elberta Fortis M.D.   On: 01/02/2016 10:14   Ct Head Wo Contrast  Result Date: 01/02/2016 CLINICAL DATA:  35 year old male with loss of consciousness, with fall in the shower, and confusion since yesterday. EXAM: CT HEAD WITHOUT CONTRAST TECHNIQUE: Contiguous axial images were obtained from the base of the skull through the vertex without intravenous contrast. COMPARISON:  No priors. FINDINGS: Brain: No evidence of acute infarction, hemorrhage, hydrocephalus, extra-axial collection or mass lesion/mass effect. Vascular: No hyperdense vessel or unexpected calcification. Skull: Normal. Negative for fracture or focal lesion. Sinuses/Orbits: No acute finding. Other: None. IMPRESSION: 1. No acute intracranial abnormalities. 2. The appearance of the brain is normal. Electronically Signed   By: Brayton Mars.D.  On: 01/02/2016 10:04   Mr Brain Wo Contrast (neuro Protocol)  Result Date: 01/02/2016 CLINICAL DATA:  Fall in shower. Altered mental status with transient global amnesia. CVA versus seizure. EXAM: MRI HEAD WITHOUT CONTRAST TECHNIQUE: Multiplanar, multiecho pulse sequences of the brain and surrounding structures were obtained without intravenous contrast. COMPARISON:  Head CT from earlier today FINDINGS: Brain: Both hippocampi by are mildly swollen and are T2/FLAIR hyperintense with DWI hyperintensity (likely combination of shine through and true restriction based on ADC map). This is consistent with seizure phenomenon. Encephalitis is not likely given the asymmetric limited appearance and clinical status. No other finding to explain a seizure focus. No asymmetric appearance to suggest a focal underlying mass. Diffusion changes in hippocampi are usually reversible in seizure; no suspected infarct. No hemorrhage, hydrocephalus, or shift. Vascular: Normal flow voids. Skull and upper cervical spine: Normal marrow signal. Sinuses/Orbits: Negative. Other: None. IMPRESSION: Symmetric signal abnormality in the  hippocampi consistent with seizure phenomenon. Elsewhere negative brain. Electronically Signed   By: Marnee SpringJonathon  Watts M.D.   On: 01/02/2016 15:02    Procedures Procedures (including critical care time)  Medications Ordered in ED Medications  sodium chloride 0.9 % bolus 1,000 mL (0 mLs Intravenous Stopped 01/02/16 1156)    And  0.9 %  sodium chloride infusion ( Intravenous Rate/Dose Change 01/02/16 1329)  levETIRAcetam (KEPPRA) tablet 500 mg (500 mg Oral Given 01/02/16 1525)     Initial Impression / Assessment and Plan / ED Course  I have reviewed the triage vital signs and the nursing notes.  Pertinent labs & imaging results that were available during my care of the patient were reviewed by me and considered in my medical decision making (see chart for details).  Clinical Course    Update: Patient remains confused, now states that his wife is here with him, but he is alone. We discussed findings thus far, patient seems to understand. I discussed patient's case with neurology given concern for persistent altered state, concern for new seizure versus transient global amnesia. We agreed that this would be an unusual presentation for either.  5:05 PM  Following return from his MRI, the patient remains in similar condition, with forgetfulness even of events that occurred within the past few minutes. The patient's wife recently arrived. She states the patient was in his usual state of health yesterday, had unusual episode, overnight when he went to shower, and after the substantial amount time, she found him on the ground in the shower. Patient was slow to respond, but subsequently was breathing spontaneously, fell asleep. She states that at that time the patient has had no memory of that event, nor other recent events.   With patient's persistent confusion I again discussed this case with our neurology colleagues. Given abnormal MRI, and persistent altered mental status, he will be  transferred to our facility for further evaluation.  Final Clinical Impressions(s) / ED Diagnoses  Young male presents after an unusual event that occurred overnight, now with persistent forgetfulness, altered mental status. Patient has no history of seizures, nor neurologic dysfunction. Patient has acknowledged some substance abuse, though none recently. Here the initial evaluation is concerning for transient global amnesia versus seizure. Both of these possibilities remain considerations, as the patient had abnormal MRI. After multiple conversations with our neurology colleagues, and with persistent symptoms, the patient was admitted for further evaluation and management.   Gerhard Munchobert Praneeth Bussey, MD 01/02/16 [phone]

## 2016-01-03 ENCOUNTER — Observation Stay (HOSPITAL_COMMUNITY): Payer: Self-pay

## 2016-01-03 DIAGNOSIS — R7989 Other specified abnormal findings of blood chemistry: Secondary | ICD-10-CM

## 2016-01-03 DIAGNOSIS — R413 Other amnesia: Secondary | ICD-10-CM

## 2016-01-03 DIAGNOSIS — R945 Abnormal results of liver function studies: Secondary | ICD-10-CM

## 2016-01-03 LAB — AMMONIA: Ammonia: 57 umol/L — ABNORMAL HIGH (ref 9–35)

## 2016-01-03 LAB — CSF CELL COUNT WITH DIFFERENTIAL
Eosinophils, CSF: 0 % (ref 0–1)
RBC COUNT CSF: 0 /mm3
Tube #: 1
WBC CSF: 1 /mm3 (ref 0–5)

## 2016-01-03 LAB — CBC
HEMATOCRIT: 34 % — AB (ref 39.0–52.0)
Hemoglobin: 11.5 g/dL — ABNORMAL LOW (ref 13.0–17.0)
MCH: 31.5 pg (ref 26.0–34.0)
MCHC: 33.8 g/dL (ref 30.0–36.0)
MCV: 93.2 fL (ref 78.0–100.0)
Platelets: 187 10*3/uL (ref 150–400)
RBC: 3.65 MIL/uL — ABNORMAL LOW (ref 4.22–5.81)
RDW: 12.5 % (ref 11.5–15.5)
WBC: 5.7 10*3/uL (ref 4.0–10.5)

## 2016-01-03 LAB — COMPREHENSIVE METABOLIC PANEL
ALBUMIN: 3.3 g/dL — AB (ref 3.5–5.0)
ALT: 298 U/L — ABNORMAL HIGH (ref 17–63)
AST: 112 U/L — AB (ref 15–41)
Alkaline Phosphatase: 55 U/L (ref 38–126)
Anion gap: 8 (ref 5–15)
BUN: 10 mg/dL (ref 6–20)
CHLORIDE: 106 mmol/L (ref 101–111)
CO2: 25 mmol/L (ref 22–32)
Calcium: 8.5 mg/dL — ABNORMAL LOW (ref 8.9–10.3)
Creatinine, Ser: 0.71 mg/dL (ref 0.61–1.24)
GFR calc Af Amer: >60 mL/min (ref >60)
GFR calc non Af Amer: >60 mL/min (ref >60)
GLUCOSE: 121 mg/dL — AB (ref 65–99)
POTASSIUM: 3.2 mmol/L — AB (ref 3.5–5.1)
Sodium: 139 mmol/L (ref 135–145)
Total Bilirubin: 0.9 mg/dL (ref 0.3–1.2)
Total Protein: 5.8 g/dL — ABNORMAL LOW (ref 6.5–8.1)

## 2016-01-03 LAB — HERPES SIMPLEX VIRUS(HSV) DNA BY PCR
HSV 1 DNA: NEGATIVE
HSV 2 DNA: NEGATIVE

## 2016-01-03 LAB — PROTEIN AND GLUCOSE, CSF
GLUCOSE CSF: 70 mg/dL (ref 40–70)
TOTAL PROTEIN, CSF: 23 mg/dL (ref 15–45)

## 2016-01-03 LAB — TSH: TSH: 1.054 u[IU]/mL (ref 0.350–4.500)

## 2016-01-03 LAB — GLUCOSE, CAPILLARY: GLUCOSE-CAPILLARY: 99 mg/dL (ref 65–99)

## 2016-01-03 LAB — URINE CULTURE
Culture: NO GROWTH
SPECIAL REQUESTS: NORMAL

## 2016-01-03 LAB — RPR: RPR Ser Ql: NONREACTIVE

## 2016-01-03 LAB — HIV ANTIBODY (ROUTINE TESTING W REFLEX): HIV Screen 4th Generation wRfx: NONREACTIVE

## 2016-01-03 MED ORDER — POTASSIUM CHLORIDE CRYS ER 20 MEQ PO TBCR
40.0000 meq | EXTENDED_RELEASE_TABLET | Freq: Once | ORAL | Status: AC
  Administered 2016-01-03: 40 meq via ORAL
  Filled 2016-01-03: qty 2

## 2016-01-03 MED ORDER — SODIUM CHLORIDE 0.9 % IV SOLN
500.0000 mg | Freq: Two times a day (BID) | INTRAVENOUS | Status: DC
  Administered 2016-01-03 – 2016-01-05 (×5): 500 mg via INTRAVENOUS
  Filled 2016-01-03 (×7): qty 5

## 2016-01-03 MED ORDER — LACTULOSE 10 GM/15ML PO SOLN
20.0000 g | Freq: Three times a day (TID) | ORAL | Status: DC
Start: 2016-01-03 — End: 2016-01-04
  Administered 2016-01-03 (×4): 20 g via ORAL
  Filled 2016-01-03 (×4): qty 30

## 2016-01-03 MED ORDER — THIAMINE HCL 100 MG/ML IJ SOLN
100.0000 mg | Freq: Every day | INTRAMUSCULAR | Status: DC
  Administered 2016-01-03 – 2016-01-05 (×3): 100 mg via INTRAVENOUS
  Filled 2016-01-03 (×3): qty 2

## 2016-01-03 NOTE — Care Management Note (Signed)
Case Management Note  Patient Details  Name: Marco MedinaJason Erny MRN: [id number] Date of Birth: 06/14/1980  Subjective/Objective:  Pt in with transient amnesia. He is from home with his spouse. Pt does not have PCP or insurance.                   Action/Plan: Plan is to discharge home when medically ready. Pt being followed by Jasmin with financial counseling. CM following for d/c needs.   Expected Discharge Date:   (unknown)               Expected Discharge Plan:  Home/Self Care  In-House Referral:     Discharge planning Services     Post Acute Care Choice:    Choice offered to:     DME Arranged:    DME Agency:     HH Arranged:    HH Agency:     Status of Service:  In process, will continue to follow  If discussed at Long Length of Stay Meetings, dates discussed:    Additional Comments:  Kermit BaloKelli F Turkessa Ostrom, RN 01/03/2016, 1:21 PM

## 2016-01-03 NOTE — Progress Notes (Signed)
EEG Completed; Results Pending  

## 2016-01-03 NOTE — Procedures (Signed)
ELECTROENCEPHALOGRAM REPORT  Date of Study: 01/03/2016  Patient's Name: Marco MedinaJason Goodnough MRN: [id number] Date of Birth: February 03, 1981  Referring Provider: Cleora Fleetlanford L. Johnson, MD  Clinical History: 35 year old man with loss of consciousness.  Medications: Hospital Medications L1 0.9 % sodium chloride infusion   acyclovir (ZOVIRAX) 730 mg in dextrose 5 % 150 mL IVPB   enoxaparin (LOVENOX) injection 40 mg   ibuprofen (ADVIL,MOTRIN) tablet 400 mg   lactulose (CHRONULAC) 10 GM/15ML solution 20 g   nicotine (NICODERM CQ - dosed in mg/24 hours) patch 14 mg   sodium chloride flush (NS) 0.9 % injection 3 mL   thiamine (B-1) injection 100 mg   zolpidem (AMBIEN) tablet 5 mg  Technical Summary: A multichannel digital EEG recording measured by the international 10-20 system with electrodes applied with paste and impedances below 5000 ohms performed in our laboratory with EKG monitoring in an awake and asleep patient.  Hyperventilation was not performed.  Photic stimulation was performed.  The digital EEG was referentially recorded, reformatted, and digitally filtered in a variety of bipolar and referential montages for optimal display.    Description: The patient is awake and asleep during the recording.  During maximal wakefulness, there is a symmetric, medium voltage 9 Hz posterior dominant rhythm that attenuates with eye opening.  The record is symmetric.  During drowsiness and sleep, there is an increase in theta slowing of the background.  Vertex waves and symmetric sleep spindles were seen.  Photic stimulation did not elicit any abnormalities.  There were no epileptiform discharges or electrographic seizures seen.    EKG lead was unremarkable.  Impression: This awake and asleep EEG is normal.    Clinical Correlation: A normal EEG does not exclude a clinical diagnosis of epilepsy.  If further clinical questions remain, prolonged EEG may be helpful.  Clinical correlation is advised.   Shon MilletAdam  Justus Droke, DO

## 2016-01-03 NOTE — Progress Notes (Signed)
Neurology Progress Note  Subjective: He has no particular complaints this morning. He does not remember why he is in the hospital and does not recall having a spinal tap last night. He denies headache, nausea, vomiting. He has no neurologic complaints. On questioning, he admits to occasional cocaine and heroin use, both IV. He states that he has not used either in awhile, cannot recall the exact time last used. I informed him his urine drug screen tested positive for opiates but he denies taking anything. He says that he has not been able to find heroin recently and he does not use pills or other forms of opiates. He has not received any narcotics in ED or hospital.   Current Meds:   Current Facility-Administered Medications:  .  [COMPLETED] sodium chloride 0.9 % bolus 1,000 mL, 1,000 mL, Intravenous, Once, Stopped at 01/02/16 1156 **AND** 0.9 %  sodium chloride infusion, , Intravenous, Continuous, Gerhard Munch, MD, Last Rate: 125 mL/hr at 01/03/16 0601 .  acyclovir (ZOVIRAX) 730 mg in dextrose 5 % 150 mL IVPB, 730 mg, Intravenous, Q8H, Gerilyn Nestle, RPH, 730 mg at 01/03/16 0558 .  enoxaparin (LOVENOX) injection 40 mg, 40 mg, Subcutaneous, Q24H, Clanford L Johnson, MD .  ibuprofen (ADVIL,MOTRIN) tablet 400 mg, 400 mg, Oral, Q6H PRN, Clanford Cyndie Mull, MD .  lactulose (CHRONULAC) 10 GM/15ML solution 20 g, 20 g, Oral, TID, Rejeana Brock, MD, 20 g at 01/03/16 0435 .  nicotine (NICODERM CQ - dosed in mg/24 hours) patch 14 mg, 14 mg, Transdermal, Daily PRN, Clanford Cyndie Mull, MD .  sodium chloride flush (NS) 0.9 % injection 3 mL, 3 mL, Intravenous, Q12H, Clanford Cyndie Mull, MD, 3 mL at 01/02/16 2256 .  thiamine (B-1) injection 100 mg, 100 mg, Intravenous, Daily, Rejeana Brock, MD .  zolpidem (AMBIEN) tablet 5 mg, 5 mg, Oral, QHS PRN,MR X 1, Clanford L Johnson, MD  Objective:  Temp:  [97.6 F (36.4 C)-98.3 F (36.8 C)] 97.6 F (36.4 C) (09/29 0528) Pulse Rate:  [50-76] 73 (09/29  0528) Resp:  [14-21] 18 (09/29 0528) BP: (91-111)/(53-76) 111/76 (09/29 0528) SpO2:  [96 %-100 %] 100 % (09/29 0528) Weight:  [63.5 kg (140 lb)-81.3 kg (179 lb 3.2 oz)] 81.3 kg (179 lb 3.2 oz) (09/28 2029)  General: WDWN Caucasian man in NAD. Alert, oriented to all but day and date. Speech is clear without dysarthria. Affect is bright. Comportment is normal. Spontaneous recall is 0/3 items at 2 minutes; he is able to get 2 items with semantic cues but is unable to recall the third even with cues. He is able to spell world backwards.  HEENT: Neck is supple without lymphadenopathy. Mucous membranes are moist and the oropharynx is clear. Sclerae are anicteric. There is no conjunctival injection.  CV: Regular, no murmur. Carotid pulses are 2+ and symmetric with no bruits. Distal pulses 2+ and symmetric.  Lungs: CTAB  Extremities: No C/C/E. Neuro: MS: As noted above. No aphasia.  CN: Pupils are equal and reactive from 3-->2 mm bilaterally. EOMI, no nystagmus. Facial sensation is intact to light touch. Face is symmetric at rest with normal strength and mobility. Hearing is intact to conversational voice. Voice is normal in tone and quality. Palate elevates symmetrically. Uvula is midline. Bilateral SCM and trapezii are 5/5. Tongue is midline with normal bulk and mobility.  Motor: Normal bulk, tone, and strength throughout. No pronator drift. No tremor or other abnormal movements are observed.  Sensation: Intact to light touch, pinprick, vibration,  and joint position.  DTRs: 2+, symmetric. Toes are downgoing bilaterally. No pathological reflexes.  Coordination: Finger-to-nose and heel-to-shin are without dysmetria bilaterally.  Gait: He was walking back to bed from the bathroom when I came into the room. He appears slow but steady, holding onto his IV pole.   Labs: Lab Results  Component Value Date   WBC 5.7 01/03/2016   HGB 11.5 (L) 01/03/2016   HCT 34.0 (L) 01/03/2016   PLT 187 01/03/2016    GLUCOSE 121 (H) 01/03/2016   ALT 298 (H) 01/03/2016   AST 112 (H) 01/03/2016   NA 139 01/03/2016   K 3.2 (L) 01/03/2016   CL 106 01/03/2016   CREATININE 0.71 01/03/2016   BUN 10 01/03/2016   CO2 25 01/03/2016   TSH 1.054 01/03/2016   INR 1.03 01/02/2016   CBC Latest Ref Rng & Units 01/03/2016 01/02/2016 10/04/2014  WBC 4.0 - 10.5 K/uL 5.7 12.9(H) 5.0  Hemoglobin 13.0 - 17.0 g/dL 11.5(L) 13.7 14.5  Hematocrit 39.0 - 52.0 % 34.0(L) 38.9(L) 41.2  Platelets 150 - 400 K/uL 187 236 160    No results found for: HGBA1C Lab Results  Component Value Date   ALT 298 (H) 01/03/2016   AST 112 (H) 01/03/2016   ALKPHOS 55 01/03/2016   BILITOT 0.9 01/03/2016   Ethanol <5 TUDS positive for opiates UA-->glucose 250, protein 30 AST 151-->112 ALT 415-->298 Lactate 1.34 NH3 57  CSF wbc 1, rbc 0, protein 23, glucose 70, gram stain negative  Pending labs:  CSF arbovirus panel CSF HSV PCR CSF culture CSF Lyme titers CSF IgG index RPR HIV Acute hepatitis panel Vitamin B1  Radiology:  I have personally and independently reviewed the MRI brain without contrast from 01/02/16. This shows symmetric restricted diffusion in both hippocampi with associated hypointensity on ADC, particularly on the L. There is mild corresponding T2/FLAIR hyperintensity in the hippocampi. The brain is otherwise unremarkable.   Other diagnostic studies:  EEG reviewed and shows mild slowing most pronounced posteriorly, no epileptiform activity or seizure. Official report pending.   A/P:   1. Probable seizure: History is concerning for prolonged seizure that was unwitnessed. MRI brain would support this with bilateral hyperintensity and restricted diffusion in the hippocampi. Etiology for his seizure is unclear. He is afebrile with unremarkable LP, making viral encephalitis less likely, though still possible. He has elevated LFTs but no other significant metabolic derangements. It is not clear how these may be related to  his apparent seizure, if at all. Hepatitis A infection can cause encephalitis, but this is typically associated with CSF pleocytosis. Some of the arboviruses and rickettsial infections can also affect both liver and CNS. Cannot exclude unknown ingestion/intoxication given h/o polysubstance abuse and positive TUDS but patient denies recent use. Awaiting acute hepatitis panel, CSF HSV PCR, CSF arbovirus panel, and CSF Lyme titers. Will add serum RMSF titers, Ehrlichia panel, and buffy coat smear to evaluation. Continue Keppra for now. Continue acyclovir until HSV PCR back and confirmed to be negative. Seizure precautions.   2. Acute encephalopathy: This is primarily impairment of short-term memory, consistent with bilateral hippocampus dysfunction. Treatment is supportive. Lactulose was started for mild hyperammonemia in the setting of hepatic dysfunction, can continue for now. Avoid CNS active medications, particularly benzos, opiates, and anything with strong anticholinergic properties. Benzos should be reserved for seizure activity lasting longer than two minutes. Monitor for withdrawal symptoms from opiates given positive TUDS, though he denies both regular and recent use. He denies heavy EtOH  but will watch for withdrawal symptoms from this as well.   No family present at the bedside. Patient is in agreement with the plan as noted above. I will continue to follow with you. Please call with any questions or concerns.   Rhona Leavensimothy Oster, MD [facility]

## 2016-01-03 NOTE — Progress Notes (Signed)
[facility]                                                                              Patient Demographics  Marco Lynch, is a 35 y.o. male, DOB - [birth year], ZOX:[id number]  Admit date - 01/02/2016   Admitting Physician Cleora Fleet, MD  Outpatient Primary MD for the patient is No primary care provider on file.  Outpatient specialists:   LOS - 0  days    Chief Complaint  Patient presents with  . Loss of Consciousness    Patient does not recall falling; last thing remembered was being in the shower; patient cannot recall day of the week, alert to self and location.       Brief summary   Marco Lynch is a 35 y.o. male smoker with history of cocaine and heroin presented to [facility] ED after he reportedly passed out while he was in the shower but does not recall what really happened. He stated that he had a mild headache he does not recall getting up and getting dressed or arriving at the hospital. He had been confused about the day of the week. He does not have a history of epilepsy but there was some concern he may have had a seizure. He had a normal CT of the brain. However, he had an MRI of the brain that was concerning for possible seizure. Neurology was consult treated and they requested to evaluate the patient at [facility]. The patient denied having chest pain nausea vomiting shortness of breath. He had an essentially normal neurological exam. He was given a dose of Keppra in the emergency department. The patient denied any recent recreational drug use. He denied alcohol use.   Assessment & Plan   Transient amnesia/acute encephalopathy with possible seizure-like activity - Currently stable, MRI of the brain showed symmetric signal abnormality in the hippocampi consistent with seizure phenomena otherwise negative - Neurology recommended checking EEG, ammonia level, B1. EEG was normal but recommended prolonged EEG if any further questions - Unremarkable  LP, neurology also ordered CSF studies with Lyme titers, arbovirus panel, HSV, RMSF, ehrlichia -  hepatitis panel pending, continue seizure precautions  - Continue Keppra placed on IV acyclovir  Transaminitis - Per patient no prior history of abnormal liver enzymes, follow right upper quadrant ultrasound - Follow hepatitis panel  Acute encephalopathy - Currently improving, neurology, and supportive treatment, lactulose. - Monitor for any benzo withdrawal, alcohol withdrawal.  Hypokalemia Replaced  Nicotine abuse - Patient counseled on smoking cessation, nicotine patch placed  Code Status:full  DVT Prophylaxis:  Lovenox  Family Communication: Discussed in detail with the patient, all imaging results, lab results explained to the patient  Disposition Plan:   Time Spent in minutes  25 minutes  Procedures:  MRI brain Lumbar puncture  EEG   Consults :   Neurology   Antimicrobials :      Medications  Scheduled Meds: . acyclovir  730 mg Intravenous Q8H  . enoxaparin (LOVENOX) injection  40 mg Subcutaneous Q24H  . lactulose  20 g Oral TID  . levETIRAcetam  500 mg Intravenous Q12H  . sodium  chloride flush  3 mL Intravenous Q12H  . thiamine injection  100 mg Intravenous Daily   Continuous Infusions: . sodium chloride 125 mL/hr at 01/03/16 0601   PRN Meds:.ibuprofen, nicotine, zolpidem   Antibiotics   Anti-infectives    Start     Dose/Rate Route Frequency Ordered Stop   01/02/16 2300  acyclovir (ZOVIRAX) 730 mg in dextrose 5 % 150 mL IVPB     730 mg 164.6 mL/hr over 60 Minutes Intravenous Every 8 hours 01/02/16 2218          Subjective:   Marco Lynch was seen and examined today. Still appears to be somewhat confused, does not remember why he is in the hospital.  No acute complaints. Patient denies dizziness, chest pain, shortness of breath, abdominal pain, N/V/D/C, new weakness, numbess, tingling.no fevers  Objective:   Vitals:   01/02/16 2029 01/03/16  0138 01/03/16 0528 01/03/16 0918  BP: 108/69 (!) 101/56 111/76 123/77  Pulse: (!) 58 66 73 62  Resp: 18 18 18 20   Temp: 98.3 F (36.8 C) 97.7 F (36.5 C) 97.6 F (36.4 C) 98 F (36.7 C)  TempSrc: Oral Oral Oral Oral  SpO2: 100% 97% 100% 100%  Weight: 81.3 kg (179 lb 3.2 oz)     Height: 5\' 10"  (1.778 m)       Intake/Output Summary (Last 24 hours) at 01/03/16 1213 Last data filed at 01/02/16 2256  Gross per 24 hour  Intake                3 ml  Output                0 ml  Net                3 ml     Wt Readings from Last 3 Encounters:  01/02/16 81.3 kg (179 lb 3.2 oz)     Exam  General: Alert and oriented x 2, NAD, somewhat confused  HEENT:  PERRLA, EOMI, Anicteric Sclera, mucous membranes moist.   Neck: Supple, no JVD, no masses  Cardiovascular: S1 S2 auscultated, no rubs, murmurs or gallops. Regular rate and rhythm.  Respiratory: Clear to auscultation bilaterally, no wheezing, rales or rhonchi  Gastrointestinal: Soft, nontender, nondistended, + bowel sounds  Ext: no cyanosis clubbing or edema  Neuro: AAOx3, Cr N's II- XII. Strength 5/5 upper and lower extremities bilaterally  Skin: No rashes  Psych: appears to be somewhat confused   Data Reviewed:  I have personally reviewed following labs and imaging studies  Micro Results Recent Results (from the past 240 hour(s))  Urine culture     Status: None   Collection Time: 01/02/16 10:45 AM  Result Value Ref Range Status   Specimen Description URINE, CLEAN CATCH  Final   Special Requests unknown Normal  Final   Culture NO GROWTH Performed at [facility]   Final   Report Status 01/03/2016 FINAL  Final  CSF culture     Status: None (Preliminary result)   Collection Time: 01/02/16 10:55 PM  Result Value Ref Range Status   Specimen Description CSF  Final   Special Requests NONE  Final   Gram Stain NO WBC SEEN NO ORGANISMS SEEN   Final   Culture NO GROWTH < 12 HOURS  Final   Report Status PENDING   Incomplete    Radiology Reports Dg Chest 2 View  Result Date: 01/02/2016 CLINICAL DATA:  Fall in shower this morning hitting head with altered level  of consciousness. EXAM: CHEST  2 VIEW COMPARISON:  10/04/2014 FINDINGS: Lungs are adequately inflated without consolidation or effusion. No pneumothorax. Cardiomediastinal silhouette is within normal. Remaining bones and soft tissues are within normal. IMPRESSION: No acute findings. Electronically Signed   By: Elberta Fortisaniel  Boyle M.D.   On: 01/02/2016 10:14   Ct Head Wo Contrast  Result Date: 01/02/2016 CLINICAL DATA:  35 year old male with loss of consciousness, with fall in the shower, and confusion since yesterday. EXAM: CT HEAD WITHOUT CONTRAST TECHNIQUE: Contiguous axial images were obtained from the base of the skull through the vertex without intravenous contrast. COMPARISON:  No priors. FINDINGS: Brain: No evidence of acute infarction, hemorrhage, hydrocephalus, extra-axial collection or mass lesion/mass effect. Vascular: No hyperdense vessel or unexpected calcification. Skull: Normal. Negative for fracture or focal lesion. Sinuses/Orbits: No acute finding. Other: None. IMPRESSION: 1. No acute intracranial abnormalities. 2. The appearance of the brain is normal. Electronically Signed   By: Trudie Reedaniel  Entrikin M.D.   On: 01/02/2016 10:04   Mr Brain Wo Contrast (neuro Protocol)  Result Date: 01/02/2016 CLINICAL DATA:  Fall in shower. Altered mental status with transient global amnesia. CVA versus seizure. EXAM: MRI HEAD WITHOUT CONTRAST TECHNIQUE: Multiplanar, multiecho pulse sequences of the brain and surrounding structures were obtained without intravenous contrast. COMPARISON:  Head CT from earlier today FINDINGS: Brain: Both hippocampi by are mildly swollen and are T2/FLAIR hyperintense with DWI hyperintensity (likely combination of shine through and true restriction based on ADC map). This is consistent with seizure phenomenon. Encephalitis is not likely  given the asymmetric limited appearance and clinical status. No other finding to explain a seizure focus. No asymmetric appearance to suggest a focal underlying mass. Diffusion changes in hippocampi are usually reversible in seizure; no suspected infarct. No hemorrhage, hydrocephalus, or shift. Vascular: Normal flow voids. Skull and upper cervical spine: Normal marrow signal. Sinuses/Orbits: Negative. Other: None. IMPRESSION: Symmetric signal abnormality in the hippocampi consistent with seizure phenomenon. Elsewhere negative brain. Electronically Signed   By: Marnee SpringJonathon  Watts M.D.   On: 01/02/2016 15:02    Lab Data:  CBC:  Recent Labs Lab 01/02/16 1045 01/03/16 0041  WBC 12.9* 5.7  NEUTROABS 9.7*  --   HGB 13.7 11.5*  HCT 38.9* 34.0*  MCV 90.9 93.2  PLT 236 187   Basic Metabolic Panel:  Recent Labs Lab 01/02/16 1045 01/03/16 0041  NA 138 139  K 3.6 3.2*  CL 102 106  CO2 27 25  GLUCOSE 112* 121*  BUN 13 10  CREATININE 0.77 0.71  CALCIUM 9.3 8.5*   GFR: Estimated Creatinine Clearance: 134.3 mL/min (by C-G formula based on SCr of 0.71 mg/dL). Liver Function Tests:  Recent Labs Lab 01/02/16 1045 01/03/16 0041  AST 151* 112*  ALT 415* 298*  ALKPHOS 85 55  BILITOT 0.9 0.9  PROT 8.2* 5.8*  ALBUMIN 4.6 3.3*   No results for input(s): LIPASE, AMYLASE in the last 168 hours.  Recent Labs Lab 01/03/16 0041  AMMONIA 57*   Coagulation Profile:  Recent Labs Lab 01/02/16 1045  INR 1.03   Cardiac Enzymes: No results for input(s): CKTOTAL, CKMB, CKMBINDEX, TROPONINI in the last 168 hours. BNP (last 3 results) No results for input(s): PROBNP in the last 8760 hours. HbA1C: No results for input(s): HGBA1C in the last 72 hours. CBG:  Recent Labs Lab 01/02/16 0913 01/03/16 0534  GLUCAP 127* 99   Lipid Profile: No results for input(s): CHOL, HDL, LDLCALC, TRIG, CHOLHDL, LDLDIRECT in the last 72 hours. Thyroid Function  Tests:  Recent Labs  01/03/16 0041  TSH  1.054   Anemia Panel: No results for input(s): VITAMINB12, FOLATE, FERRITIN, TIBC, IRON, RETICCTPCT in the last 72 hours. Urine analysis:    Component Value Date/Time   COLORURINE YELLOW 01/02/2016 1045   APPEARANCEUR CLEAR 01/02/2016 1045   LABSPEC 1.020 01/02/2016 1045   PHURINE 6.0 01/02/2016 1045   GLUCOSEU 250 (A) 01/02/2016 1045   HGBUR NEGATIVE 01/02/2016 1045   BILIRUBINUR NEGATIVE 01/02/2016 1045   KETONESUR NEGATIVE 01/02/2016 1045   PROTEINUR 30 (A) 01/02/2016 1045   UROBILINOGEN 0.2 10/04/2014 0351   NITRITE NEGATIVE 01/02/2016 1045   LEUKOCYTESUR NEGATIVE 01/02/2016 1045     Heaven Meeker M.D. [facility] 01/03/2016, 12:13 PM  Pager: [phone] Between 7am to 7pm - call Pager - [phone]  After 7pm go to [email or web address] - password [email or web address]  Call night coverage person covering after 7pm

## 2016-01-04 LAB — COMPREHENSIVE METABOLIC PANEL
ALK PHOS: 56 U/L (ref 38–126)
ALT: 282 U/L — AB (ref 17–63)
ANION GAP: 10 (ref 5–15)
AST: 98 U/L — ABNORMAL HIGH (ref 15–41)
Albumin: 3.6 g/dL (ref 3.5–5.0)
BILIRUBIN TOTAL: 1 mg/dL (ref 0.3–1.2)
BUN: <5 mg/dL — ABNORMAL LOW (ref 6–20)
CALCIUM: 8.8 mg/dL — AB (ref 8.9–10.3)
CO2: 20 mmol/L — AB (ref 22–32)
CREATININE: 0.64 mg/dL (ref 0.61–1.24)
Chloride: 112 mmol/L — ABNORMAL HIGH (ref 101–111)
Glucose, Bld: 96 mg/dL (ref 65–99)
Potassium: 3.9 mmol/L (ref 3.5–5.1)
SODIUM: 142 mmol/L (ref 135–145)
TOTAL PROTEIN: 6.2 g/dL — AB (ref 6.5–8.1)

## 2016-01-04 LAB — CBC
HCT: 37.6 % — ABNORMAL LOW (ref 39.0–52.0)
HEMOGLOBIN: 12.9 g/dL — AB (ref 13.0–17.0)
MCH: 31.2 pg (ref 26.0–34.0)
MCHC: 34.3 g/dL (ref 30.0–36.0)
MCV: 90.8 fL (ref 78.0–100.0)
PLATELETS: 189 10*3/uL (ref 150–400)
RBC: 4.14 MIL/uL — ABNORMAL LOW (ref 4.22–5.81)
RDW: 12 % (ref 11.5–15.5)
WBC: 4.5 10*3/uL (ref 4.0–10.5)

## 2016-01-04 LAB — HEPATITIS PANEL, ACUTE
HCV Ab: >11 s/co ratio — ABNORMAL HIGH (ref 0.0–0.9)
HEP B C IGM: NEGATIVE
HEP B S AG: NEGATIVE
Hep A IgM: NEGATIVE
Hep A IgM: NEGATIVE
Hep B C IgM: NEGATIVE
Hepatitis B Surface Ag: NEGATIVE

## 2016-01-04 LAB — GLUCOSE, CAPILLARY: GLUCOSE-CAPILLARY: 102 mg/dL — AB (ref 65–99)

## 2016-01-04 LAB — AMMONIA: AMMONIA: 31 umol/L (ref 9–35)

## 2016-01-04 NOTE — Progress Notes (Signed)
Neurology Progress Note  Subjective: He is unable to recall any of the events since going to take a shower on 01/01/16. He asked the same questions twice during our conversation this morning. I asked again about when he last used IV drugs and today he responded "that's probably what landed me here." He thinks he may have used cocaine as he reports this is what he usually uses. When I told him we found opiates but not cocaine on his drug screen he acknowledged that he may have used heroin. However, he still has no definite recollection of using. He denies any complaints or concerns today. He remains NPO but denies appetite. No nausea, vomiting, diarrhea.   Current Meds:   Current Facility-Administered Medications:  .  [COMPLETED] sodium chloride 0.9 % bolus 1,000 mL, 1,000 mL, Intravenous, Once, Stopped at 01/02/16 1156 **AND** 0.9 %  sodium chloride infusion, , Intravenous, Continuous, Gerhard Munch, MD, Last Rate: 125 mL/hr at 01/04/16 0523 .  acyclovir (ZOVIRAX) 730 mg in dextrose 5 % 150 mL IVPB, 730 mg, Intravenous, Q8H, Gerilyn Nestle, RPH, 730 mg at 01/04/16 1191 .  enoxaparin (LOVENOX) injection 40 mg, 40 mg, Subcutaneous, Q24H, Clanford L Johnson, MD, 40 mg at 01/03/16 2218 .  ibuprofen (ADVIL,MOTRIN) tablet 400 mg, 400 mg, Oral, Q6H PRN, Clanford L Johnson, MD .  lactulose (CHRONULAC) 10 GM/15ML solution 20 g, 20 g, Oral, TID, Rejeana Brock, MD, 20 g at 01/03/16 2218 .  levETIRAcetam (KEPPRA) 500 mg in sodium chloride 0.9 % 100 mL IVPB, 500 mg, Intravenous, Q12H, Versie Starks, MD, 500 mg at 01/03/16 2219 .  nicotine (NICODERM CQ - dosed in mg/24 hours) patch 14 mg, 14 mg, Transdermal, Daily PRN, Clanford Cyndie Mull, MD .  sodium chloride flush (NS) 0.9 % injection 3 mL, 3 mL, Intravenous, Q12H, Clanford Cyndie Mull, MD, 3 mL at 01/02/16 2256 .  thiamine (B-1) injection 100 mg, 100 mg, Intravenous, Daily, Rejeana Brock, MD, 100 mg at 01/03/16 0913 .  zolpidem (AMBIEN) tablet  5 mg, 5 mg, Oral, QHS PRN,MR X 1, Clanford L Johnson, MD  Objective:  Temp:  [98 F (36.7 C)-98.7 F (37.1 C)] 98.4 F (36.9 C) (09/30 0527) Pulse Rate:  [61-90] 63 (09/30 0527) Resp:  [18-20] 18 (09/30 0527) BP: (109-125)/(64-77) 125/70 (09/30 0527) SpO2:  [98 %-100 %] 98 % (09/30 0527)  General: WDWN Caucasian man in NAD. Alert, oriented to self, place, and year. Speech is clear without dysarthria. Affect is bright. Comportment is normal.  HEENT: Neck is supple without lymphadenopathy. Mucous membranes are moist and the oropharynx is clear. Sclerae are anicteric. There is no conjunctival injection.  CV: Regular, no murmur. Carotid pulses are 2+ and symmetric with no bruits. Distal pulses 2+ and symmetric.  Lungs: CTAB  Extremities: No C/C/E. Neuro: MS: As noted above. No aphasia.  CN: Pupils are equal and reactive from 3-->2 mm bilaterally. EOMI, no nystagmus. Facial sensation is intact to light touch. Face is symmetric at rest with normal strength and mobility. Hearing is intact to conversational voice. Voice is normal in tone and quality. Palate elevates symmetrically. Uvula is midline. Bilateral SCM and trapezii are 5/5. Tongue is midline with normal bulk and mobility.  Motor: Normal bulk, tone, and strength throughout. No pronator drift. No tremor or other abnormal movements are observed.  Sensation: Intact to light touch, pinprick, vibration, and joint position.  DTRs: 2+, symmetric. Toes are downgoing bilaterally. No pathological reflexes.  Coordination: Finger-to-nose and heel-to-shin are without  dysmetria bilaterally.    Labs: Lab Results  Component Value Date   WBC 5.7 01/03/2016   HGB 11.5 (L) 01/03/2016   HCT 34.0 (L) 01/03/2016   PLT 187 01/03/2016   GLUCOSE 96 01/04/2016   ALT 282 (H) 01/04/2016   AST 98 (H) 01/04/2016   NA 142 01/04/2016   K 3.9 01/04/2016   CL 112 (H) 01/04/2016   CREATININE 0.64 01/04/2016   BUN <5 (L) 01/04/2016   CO2 20 (L) 01/04/2016    TSH 1.054 01/03/2016   INR 1.03 01/02/2016   CBC Latest Ref Rng & Units 01/03/2016 01/02/2016 10/04/2014  WBC 4.0 - 10.5 K/uL 5.7 12.9(H) 5.0  Hemoglobin 13.0 - 17.0 g/dL 11.5(L) 13.7 14.5  Hematocrit 39.0 - 52.0 % 34.0(L) 38.9(L) 41.2  Platelets 150 - 400 K/uL 187 236 160    No results found for: HGBA1C Lab Results  Component Value Date   ALT 282 (H) 01/04/2016   AST 98 (H) 01/04/2016   ALKPHOS 56 01/04/2016   BILITOT 1.0 01/04/2016    AST 151-->112-->98 ALT 415-->298-->282 CSF HSV PCR negative  CSF cx NGTD  RPR nonreactive HIV Ab nonreactive HCV Ab >11.0 HBsAg negative Hep A IgM negative HBcIgM negative   Pending labs:  CSF arbovirus panel CSF culture CSF Lyme titers CSF IgG index RMSF serologies Ehrlichia antibody panel   Radiology:  No new imaging.   A/P:   1. Probable seizure: History is concerning for prolonged seizure that was unwitnessed. MRI brain would support this with bilateral hyperintensity and restricted diffusion in the hippocampi. Etiology for his seizure is unclear but strongly suspect Unknown ingestion/intoxication given h/o polysubstance abuse and positive TUDS. Today the patient acknowledges that he may have used something prior to this episode. He admits that he gets heroin wherever he can find it and does not have a reliable source, raising concern that the drug he used may have had impurities or may have been a different drug altogether. LP did not show evidence of infection or inflammation. CSF HSV PCR was negative and acyclovir can be discontinued. Awaiting CSF arbovirus panel, and CSF Lyme titers. Rickettsial infection is possible and Ehrlichia Ab panel and RMSF serologies are pending. He has elevated LFTs but no other significant metabolic derangements. Hepatitis C antibodies are positive; while HCV infection is associated with several neurologic complications, seizures are not typical.   Continue Keppra for now. Seizure precautions.   2. 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 Treatment is supportive. Lactulose was started for mild hyperammonemia in the setting of hepatic dysfunction;will stop this and repeat ammonia level as he is more amnestic than frankly encephalopathic. Avoid CNS active medications, particularly benzos, opiates, and anything with strong anticholinergic properties. Benzos should be reserved for seizure activity lasting longer than two minutes. Monitor for withdrawal symptoms from opiates given positive TUDS, though he denies both regular and recent use. He admits to being a heavy drinker in the past but says he stopped drinking about 10 years ago.   No family present at the bedside. Patient is in agreement with the plan as noted above. Case was discussed with Dr. Isidoro Donningai. I will continue to follow. Please call with any questions or concerns.   Rhona Leavensimothy Chanie Soucek, MD [facility]

## 2016-01-04 NOTE — Evaluation (Signed)
Physical Therapy Evaluation Patient Details Name: Lorene Samaan MRN: [id number] DOB: Jun 02, 1980 Today's Date: 01/04/2016   History of Present Illness  Pt is a 35 y/o male admitted to the ED for reported loss of consciousness while in the shower. He has no memory of the event. Pt is a current smoker and substance abuse (cocaine and heroin). No additional PMH.  Clinical Impression  Pt presented supine in bed, awake and willing to participate in therapy session. Prior to admission, pt stated that he was independent with all functional mobility. Pt continues to have memory loss regarding the event that he was admitted for and short-term memory loss since that time as well (i.e. - pt cannot recall what he ate for breakfast or lunch and could not recall if anyone has visited him since admission). Pt also very disoriented to time and situation. PT recommending pt d/c home with supervision for mobility and Outpatient PT for balance deficits. Pt would continue to benefit from skilled physical therapy services at this time while admitted and after d/c to address his limitations in order to improve his overall safety and independence with functional mobility.     Follow Up Recommendations Outpatient PT;Supervision for mobility/OOB    Equipment Recommendations  None recommended by PT    Recommendations for Other Services       Precautions / Restrictions Precautions Precautions: Fall Restrictions Weight Bearing Restrictions: No      Mobility  Bed Mobility Overal bed mobility: Needs Assistance Bed Mobility: Supine to Sit;Sit to Supine     Supine to sit: Modified independent (Device/Increase time) Sit to supine: Modified independent (Device/Increase time)   General bed mobility comments: pt required increased time, HOB slightly elevated  Transfers Overall transfer level: Needs assistance Equipment used: None Transfers: Sit to/from Stand Sit to Stand: Supervision         General transfer  comment: pt required increased time, no use of bilateral UEs  Ambulation/Gait Ambulation/Gait assistance: Min guard Ambulation Distance (Feet): 200 Feet Assistive device: None Gait Pattern/deviations: Step-through pattern;WFL(Within Functional Limits) Gait velocity: decreased Gait velocity interpretation: Below normal speed for age/gender General Gait Details: pt with mild instability during ambulation, but no LOB requiring physical assist; min guard for safety  Stairs Stairs: Yes Stairs assistance: Min guard Stair Management: One rail Right;Alternating pattern;Forwards Number of Stairs: 4 General stair comments: pt demonstrated ability to perform safely  Wheelchair Mobility    Modified Rankin (Stroke Patients Only)       Balance Overall balance assessment: Needs assistance Sitting-balance support: Feet supported;No upper extremity supported Sitting balance-Leahy Scale: Good     Standing balance support: During functional activity;No upper extremity supported Standing balance-Leahy Scale: Fair   Single Leg Stance - Right Leg: 60 (eyes closed = 8 seconds) Single Leg Stance - Left Leg: 60 (eyes closed = 4 seconds) Tandem Stance - Right Leg: 60 (eyes closed = 3 seconds)       High level balance activites: Sudden stops;Turns;Head turns High Level Balance Comments: pt able to perform with mild instability but no LOB that required physical assist to recover from             Pertinent Vitals/Pain Pain Assessment: No/denies pain    Home Living Family/patient expects to be discharged to:: Private residence Living Arrangements: Spouse/significant other;Children Available Help at Discharge: Family;Available PRN/intermittently Type of Home: House Home Access: Stairs to enter Entrance Stairs-Rails: Right Entrance Stairs-Number of Steps: 3 Home Layout: One level Home Equipment: None  Prior Function Level of Independence: Independent               Hand  Dominance        Extremity/Trunk Assessment   Upper Extremity Assessment: Overall WFL for tasks assessed           Lower Extremity Assessment: Overall WFL for tasks assessed      Cervical / Trunk Assessment: Normal  Communication   Communication: No difficulties  Cognition Arousal/Alertness: Awake/alert Behavior During Therapy: WFL for tasks assessed/performed Overall Cognitive Status: Impaired/Different from baseline Area of Impairment: Orientation;Memory;Safety/judgement Orientation Level: Disoriented to;Time;Situation   Memory: Decreased short-term memory   Safety/Judgement: Decreased awareness of safety;Decreased awareness of deficits          General Comments      Exercises     Assessment/Plan    PT Assessment Patient needs continued PT services  PT Problem List Decreased balance;Decreased coordination;Decreased cognition;Decreased safety awareness          PT Treatment Interventions Gait training;Stair training;Functional mobility training;Therapeutic activities;Therapeutic exercise;Balance training;Neuromuscular re-education;Patient/family education;Cognitive remediation    PT Goals (Current goals can be found in the Care Plan section)  Acute Rehab PT Goals Patient Stated Goal: return home PT Goal Formulation: With patient Time For Goal Achievement: 01/18/16 Potential to Achieve Goals: Good    Frequency Min 3X/week   Barriers to discharge        Co-evaluation               End of Session Equipment Utilized During Treatment: Gait belt Activity Tolerance: Patient tolerated treatment well Patient left: in bed;with call bell/phone within reach;with bed alarm set Nurse Communication: Mobility status         Time: [id number] PT Time Calculation (min) (ACUTE ONLY): 23 min   Charges:   PT Evaluation $PT Eval Moderate Complexity: 1 Procedure PT Treatments $Gait Training: 8-22 mins   PT G CodesAlessandra Bevels:        Zamiyah Resendes M Marquasia Schmieder 01/04/2016,  6:11 PM Deborah ChalkJennifer Dionne Knoop, PT, DPT [phone]

## 2016-01-04 NOTE — Progress Notes (Addendum)
[facility]                                                                              Patient Demographics  Marco Lynch, is a 35 y.o. male, DOB - [birth year], RUE:[id number]  Admit date - 01/02/2016   Admitting Physician Cleora Fleet, MD  Outpatient Primary MD for the patient is No primary care provider on file.  Outpatient specialists:   LOS - 1  days    Chief Complaint  Patient presents with  . Loss of Consciousness    Patient does not recall falling; last thing remembered was being in the shower; patient cannot recall day of the week, alert to self and location.       Brief summary   Marco Lynch is a 35 y.o. male smoker with history of cocaine and heroin presented to [facility] ED after he reportedly passed out while he was in the shower but does not recall what really happened. He stated that he had a mild headache he does not recall getting up and getting dressed or arriving at the hospital. He had been confused about the day of the week. He does not have a history of epilepsy but there was some concern he may have had a seizure. He had a normal CT of the brain. However, he had an MRI of the brain that was concerning for possible seizure. Neurology was consult treated and they requested to evaluate the patient at [facility]. The patient denied having chest pain nausea vomiting shortness of breath. He had an essentially normal neurological exam. He was given a dose of Keppra in the emergency department. The patient denied any recent recreational drug use. He denied alcohol use.   Assessment & Plan   Transient amnesia/acute encephalopathy with possible seizure-like activity - Currently stable, MRI of the brain showed symmetric signal abnormality in the hippocampi consistent with seizure phenomena otherwise negative - Neurology recommended checking EEG, ammonia level, B1. EEG was normal but recommended prolonged EEG if any further questions - Unremarkable  LP, neurology also ordered CSF studies with Lyme titers, arbovirus panel, HSV, RMSF, ehrlichia, so far pending  -  hepatitis panel negative. Ammonia level normal. continue seizure precautions  - Continue Keppra. HSV PCR neg, IV acyclovir was discontinued  - PT, OT, speech for cognitive function  Transaminitis - Per patient no prior history of abnormal liver enzymes, follow right upper quadrant ultrasound - hepatitis level negative  Acute encephalopathy appears to have amnesia  - Currently improving, neurology, and supportive treatment, - Monitor for any benzo withdrawal, alcohol withdrawal.  Nicotine abuse - Patient counseled on smoking cessation, nicotine patch placed  Code Status:full  DVT Prophylaxis:  Lovenox  Family Communication: Discussed in detail with the patient, all imaging results, lab results explained to the patient. Called patient's wife [phone], could not reach her. Other number [phone] listed on epic appears to be wrong as person on the line said that she is not related to the patient and it was a wrong number.    Disposition Plan:   Time Spent in minutes  25 minutes  Procedures:  MRI brain Lumbar  puncture  EEG   Consults :   Neurology   Antimicrobials :      Medications  Scheduled Meds: . enoxaparin (LOVENOX) injection  40 mg Subcutaneous Q24H  . levETIRAcetam  500 mg Intravenous Q12H  . sodium chloride flush  3 mL Intravenous Q12H  . thiamine injection  100 mg Intravenous Daily   Continuous Infusions: . sodium chloride 125 mL/hr at 01/04/16 0523   PRN Meds:.ibuprofen, nicotine, zolpidem   Antibiotics   Anti-infectives    Start     Dose/Rate Route Frequency Ordered Stop   01/02/16 2300  acyclovir (ZOVIRAX) 730 mg in dextrose 5 % 150 mL IVPB  Status:  Discontinued     730 mg 164.6 mL/hr over 60 Minutes Intravenous Every 8 hours 01/02/16 2218 01/04/16 0849        Subjective:   Marco Lynch was seen and examined today. Still  appears to be somewhat confused. No acute complaints. Patient denies dizziness, chest pain, shortness of breath, abdominal pain, N/V/D/C, new weakness, numbess, tingling.no fevers  Objective:   Vitals:   01/03/16 1734 01/03/16 2139 01/04/16 0119 01/04/16 0527  BP: 109/64 109/69 109/66 125/70  Pulse: 67 69 90 63  Resp: 20 20 18 18   Temp: 98.4 F (36.9 C) 98.2 F (36.8 C) 98.7 F (37.1 C) 98.4 F (36.9 C)  TempSrc: Oral Oral Oral Oral  SpO2: 100% 99% 100% 98%  Weight:      Height:        Intake/Output Summary (Last 24 hours) at 01/04/16 1250 Last data filed at 01/03/16 1500  Gross per 24 hour  Intake             1150 ml  Output                0 ml  Net             1150 ml     Wt Readings from Last 3 Encounters:  01/02/16 81.3 kg (179 lb 3.2 oz)     Exam  General: Alert and oriented x 2, NAD, somewhat confused  HEENT:  .   Neck: Supple, no JVD  Cardiovascular: S1 S2 clear, RRR  Respiratory: CTAB  Gastrointestinal: Soft, nontender, nondistended, + bowel sounds  Ext: no cyanosis clubbing or edema  Neuro: AAOx3, Cr N's II- XII. Strength 5/5 upper and lower extremities bilaterally  Skin: No rashes  Psych: appears to be somewhat confused   Data Reviewed:  I have personally reviewed following labs and imaging studies  Micro Results Recent Results (from the past 240 hour(s))  Urine culture     Status: None   Collection Time: 01/02/16 10:45 AM  Result Value Ref Range Status   Specimen Description URINE, CLEAN CATCH  Final   Special Requests unknown Normal  Final   Culture NO GROWTH Performed at [facility]   Final   Report Status 01/03/2016 FINAL  Final  CSF culture     Status: None (Preliminary result)   Collection Time: 01/02/16 10:55 PM  Result Value Ref Range Status   Specimen Description CSF  Final   Special Requests NONE  Final   Gram Stain NO WBC SEEN NO ORGANISMS SEEN   Final   Culture NO GROWTH 1 DAY  Final   Report Status PENDING   Incomplete    Radiology Reports Dg Chest 2 View  Result Date: 01/02/2016 CLINICAL DATA:  Fall in shower this morning hitting head with altered level of consciousness. EXAM: CHEST  2 VIEW COMPARISON:  10/04/2014 FINDINGS: Lungs are adequately inflated without consolidation or effusion. No pneumothorax. Cardiomediastinal silhouette is within normal. Remaining bones and soft tissues are within normal. IMPRESSION: No acute findings. Electronically Signed   By: Elberta Fortis M.D.   On: 01/02/2016 10:14   Ct Head Wo Contrast  Result Date: 01/02/2016 CLINICAL DATA:  35 year old male with loss of consciousness, with fall in the shower, and confusion since yesterday. EXAM: CT HEAD WITHOUT CONTRAST TECHNIQUE: Contiguous axial images were obtained from the base of the skull through the vertex without intravenous contrast. COMPARISON:  No priors. FINDINGS: Brain: No evidence of acute infarction, hemorrhage, hydrocephalus, extra-axial collection or mass lesion/mass effect. Vascular: No hyperdense vessel or unexpected calcification. Skull: Normal. Negative for fracture or focal lesion. Sinuses/Orbits: No acute finding. Other: None. IMPRESSION: 1. No acute intracranial abnormalities. 2. The appearance of the brain is normal. Electronically Signed   By: Trudie Reed M.D.   On: 01/02/2016 10:04   Mr Brain Wo Contrast (neuro Protocol)  Result Date: 01/02/2016 CLINICAL DATA:  Fall in shower. Altered mental status with transient global amnesia. CVA versus seizure. EXAM: MRI HEAD WITHOUT CONTRAST TECHNIQUE: Multiplanar, multiecho pulse sequences of the brain and surrounding structures were obtained without intravenous contrast. COMPARISON:  Head CT from earlier today FINDINGS: Brain: Both hippocampi by are mildly swollen and are T2/FLAIR hyperintense with DWI hyperintensity (likely combination of shine through and true restriction based on ADC map). This is consistent with seizure phenomenon. Encephalitis is not likely  given the asymmetric limited appearance and clinical status. No other finding to explain a seizure focus. No asymmetric appearance to suggest a focal underlying mass. Diffusion changes in hippocampi are usually reversible in seizure; no suspected infarct. No hemorrhage, hydrocephalus, or shift. Vascular: Normal flow voids. Skull and upper cervical spine: Normal marrow signal. Sinuses/Orbits: Negative. Other: None. IMPRESSION: Symmetric signal abnormality in the hippocampi consistent with seizure phenomenon. Elsewhere negative brain. Electronically Signed   By: Marnee Spring M.D.   On: 01/02/2016 15:02   US Abdomen Limited Ruq  Result Date: 01/03/2016 CLINICAL DATA:  Inpatient. Abnormal liver function tests. Polysubstance abuse. EXAM: US ABDOMEN LIMITED - RIGHT UPPER QUADRANT COMPARISON:  None. FINDINGS: Gallbladder: Nondistended gallbladder. No gallstones. Mild layering sludge. Upper normal gallbladder wall thickness (3 mm). No pericholecystic fluid or sonographic Murphy's sign. Common bile duct: Diameter: 4 mm Liver: No liver mass. Liver parenchymal echotexture and echogenicity are within normal limits. There is nonspecific increased echogenicity throughout the portal triads (without dirty shadowing demonstrated to suggest gas). IMPRESSION: 1. Mild gallbladder sludge. No cholelithiasis. No evidence of acute cholecystitis. 2. No biliary ductal dilatation. 3. Nonspecific increased echogenicity throughout the portal triads, which can be seen in the setting of acute hepatitis. Electronically Signed   By: Delbert Phenix M.D.   On: 01/03/2016 16:18    Lab Data:  CBC:  Recent Labs Lab 01/02/16 1045 01/03/16 0041 01/04/16 0828  WBC 12.9* 5.7 4.5  NEUTROABS 9.7*  --   --   HGB 13.7 11.5* 12.9*  HCT 38.9* 34.0* 37.6*  MCV 90.9 93.2 90.8  PLT 236 187 189   Basic Metabolic Panel:  Recent Labs Lab 01/02/16 1045 01/03/16 0041 01/04/16 0651  NA 138 139 142  K 3.6 3.2* 3.9  CL 102 106 112*  CO2 27  25 20*  GLUCOSE 112* 121* 96  BUN 13 10 <5*  CREATININE 0.77 0.71 0.64  CALCIUM 9.3 8.5* 8.8*   GFR: Estimated Creatinine Clearance: 134.3 mL/min (by  C-G formula based on SCr of 0.64 mg/dL). Liver Function Tests:  Recent Labs Lab 01/02/16 1045 01/03/16 0041 01/04/16 0651  AST 151* 112* 98*  ALT 415* 298* 282*  ALKPHOS 85 55 56  BILITOT 0.9 0.9 1.0  PROT 8.2* 5.8* 6.2*  ALBUMIN 4.6 3.3* 3.6   No results for input(s): LIPASE, AMYLASE in the last 168 hours.  Recent Labs Lab 01/03/16 0041 01/04/16 0948  AMMONIA 57* 31   Coagulation Profile:  Recent Labs Lab 01/02/16 1045  INR 1.03   Cardiac Enzymes: No results for input(s): CKTOTAL, CKMB, CKMBINDEX, TROPONINI in the last 168 hours. BNP (last 3 results) No results for input(s): PROBNP in the last 8760 hours. HbA1C: No results for input(s): HGBA1C in the last 72 hours. CBG:  Recent Labs Lab 01/02/16 0913 01/03/16 0534 01/04/16 0611  GLUCAP 127* 99 102*   Lipid Profile: No results for input(s): CHOL, HDL, LDLCALC, TRIG, CHOLHDL, LDLDIRECT in the last 72 hours. Thyroid Function Tests:  Recent Labs  01/03/16 0041  TSH 1.054   Anemia Panel: No results for input(s): VITAMINB12, FOLATE, FERRITIN, TIBC, IRON, RETICCTPCT in the last 72 hours. Urine analysis:    Component Value Date/Time   COLORURINE YELLOW 01/02/2016 1045   APPEARANCEUR CLEAR 01/02/2016 1045   LABSPEC 1.020 01/02/2016 1045   PHURINE 6.0 01/02/2016 1045   GLUCOSEU 250 (A) 01/02/2016 1045   HGBUR NEGATIVE 01/02/2016 1045   BILIRUBINUR NEGATIVE 01/02/2016 1045   KETONESUR NEGATIVE 01/02/2016 1045   PROTEINUR 30 (A) 01/02/2016 1045   UROBILINOGEN 0.2 10/04/2014 0351   NITRITE NEGATIVE 01/02/2016 1045   LEUKOCYTESUR NEGATIVE 01/02/2016 1045     Jalayia Bagheri M.D. [facility] 01/04/2016, 12:50 PM  Pager: [phone] Between 7am to 7pm - call Pager - [phone]  After 7pm go to [email or web address] - password [email or web address]  Call night coverage  person covering after 7pm

## 2016-01-05 DIAGNOSIS — R411 Anterograde amnesia: Secondary | ICD-10-CM

## 2016-01-05 LAB — CBC
HCT: 37.2 % — ABNORMAL LOW (ref 39.0–52.0)
HEMOGLOBIN: 13.4 g/dL (ref 13.0–17.0)
MCH: 31.9 pg (ref 26.0–34.0)
MCHC: 36 g/dL (ref 30.0–36.0)
MCV: 88.6 fL (ref 78.0–100.0)
Platelets: 206 10*3/uL (ref 150–400)
RBC: 4.2 MIL/uL — AB (ref 4.22–5.81)
RDW: 12.4 % (ref 11.5–15.5)
WBC: 4.5 10*3/uL (ref 4.0–10.5)

## 2016-01-05 MED ORDER — UNABLE TO FIND: 0 refills | Status: DC

## 2016-01-05 MED ORDER — IBUPROFEN 400 MG PO TABS: 400.0000 mg | ORAL_TABLET | Freq: Four times a day (QID) | ORAL | 0 refills | Status: AC | PRN

## 2016-01-05 MED ORDER — LEVETIRACETAM 500 MG PO TABS: 500.0000 mg | ORAL_TABLET | Freq: Two times a day (BID) | ORAL | 3 refills | Status: DC

## 2016-01-05 MED ORDER — VITAMIN B-1 100 MG PO TABS
100.0000 mg | ORAL_TABLET | Freq: Every day | ORAL | Status: DC
  Administered 2016-01-06: 100 mg via ORAL
  Filled 2016-01-05: qty 1

## 2016-01-05 MED ORDER — LEVETIRACETAM 500 MG PO TABS
500.0000 mg | ORAL_TABLET | Freq: Two times a day (BID) | ORAL | Status: DC
  Administered 2016-01-06 (×2): 500 mg via ORAL
  Filled 2016-01-05 (×2): qty 1

## 2016-01-05 MED ORDER — VITAMIN B-1 100 MG PO TABS: 100.0000 mg | ORAL_TABLET | Freq: Every day | ORAL | 3 refills | Status: AC

## 2016-01-05 NOTE — Care Management Note (Signed)
Case Management Note  Patient Details  Name: Marco Lynch MRN: [id number] Date of Birth: 08/01/1980  Subjective/Objective:                  Transient amnesia/acute encephalopathy with possible seizure-like activity/heroin Action/Plan: Discharge planning Expected Discharge Date:   (unknown)               Expected Discharge Plan:  Home/Self Care  In-House Referral:     Discharge planning Services  CM Consult, MATCH Program, Indigent Health Clinic, Medication Assistance  Post Acute Care Choice:    Choice offered to:  Patient  DME Arranged:  3-N-1 DME Agency:  [facility]  HH Arranged:  NA HH Agency:  NA  Status of Service:  Completed, signed off  If discussed at Long Length of Stay Meetings, dates discussed:    Additional Comments: CM met with pt and spouse of pt, Marco Lynch [phone].  Pt unable to articulate needs or provide accurate information but wife Marco Lynch states:  Family recently moved from [place]  No one is employed in the family  No one has health insurance  Marco Lynch has applied for Medicaid for child  Family is active with [facility] (CM has given family another handout for this clinic)  CM gave Marco Lynch MATCH letter with list of participating pharmacies and has enough money for copays  CM gave Marco Lynch [employer] [facility] resource number to apply for health insurance for herself and pt and to begin the process of securing disability for pt CM has notified [facility] DME rep, Marco Lynch to please deliver the 3n1 to room prior to discharge.  Per MD request, CM has faxed information to [facility] for outpt PT.  No other CM needs were communicated. Jeffries, Sarah Christine, RN 01/05/2016, 11:28 AM  

## 2016-01-05 NOTE — Evaluation (Signed)
Occupational Therapy Evaluation Patient Details Name: Marco Lynch MRN: [id number] DOB: 23-Jan-1981 Today's Date: 01/05/2016    History of Present Illness Pt is a 35 y/o male admitted to the ED for reported loss of consciousness while in the shower. He has no memory of the event. Pt is a current smoker and substance abuse (cocaine and heroin). No additional PMH.   Clinical Impression   Pt admitted with the above diagnoses and presents with below problem list. Pt will benefit from continued acute OT to address the below listed deficits and maximize independence with basic ADLs prior to d/c to venue below. PTA pt was independent with ADLs and worked 2 jobs. Pt present with impaired balance and cognition impacting level of assist with ADLs to min guard level. Pt benefited from extra time and encouragement to answer simple recall questions; could not recall what brought him to the hospital. Spouse present at end of session and included in education. Session details below.      Follow Up Recommendations  Supervision/Assistance - 24 hour;Outpatient OT    Equipment Recommendations  3 in 1 bedside comode    Recommendations for Other Services       Precautions / Restrictions Precautions Precautions: Fall Restrictions Weight Bearing Restrictions: No      Mobility Bed Mobility Overal bed mobility: Needs Assistance Bed Mobility: Supine to Sit;Sit to Supine     Supine to sit: Modified independent (Device/Increase time) Sit to supine: Modified independent (Device/Increase time)   General bed mobility comments: pt required increased time, HOB slightly elevated  Transfers Overall transfer level: Needs assistance Equipment used: None Transfers: Sit to/from Stand Sit to Stand: Supervision         General transfer comment: pt required increased time, no use of bilateral UEs    Balance Overall balance assessment: Needs assistance Sitting-balance support: No upper extremity  supported;Feet supported Sitting balance-Leahy Scale: Good     Standing balance support: No upper extremity supported;During functional activity Standing balance-Leahy Scale: Fair                              ADL Overall ADL's : Needs assistance/impaired Eating/Feeding: Set up;Sitting   Grooming: Oral care;Standing;Min guard Grooming Details (indicate cue type and reason): min guard for balance.  Upper Body Bathing: Set up;Sitting   Lower Body Bathing: Min guard;Sit to/from stand   Upper Body Dressing : Set up;Sitting   Lower Body Dressing: Min guard;Sit to/from stand Lower Body Dressing Details (indicate cue type and reason): donned sock sitting EOB Toilet Transfer: Min guard;Ambulation (3n1 over toilet)   Toileting- Clothing Manipulation and Hygiene: Min guard;Sit to/from stand;Sitting/lateral lean;Set up   Tub/ Shower Transfer: Min guard;Walk-in shower;Ambulation;3 in 1   Functional mobility during ADLs: Min guard General ADL Comments: Pt with some instability during turns with walking. Pt completed toilet transfer and grooming task at sink. Discussed with pt and spouse strategies for impaired cognition (asking pt to recall/reorienting to date and time of day and what brought him to the hosipital throughout the day, playing "brain games" i.e. puzzles, word find, etc. Giving pt time and opportunity to respond to recall questions. Also discussed neuropsychologist referall if still struggling with cognition in 2-3 months for furter, more detailed testing/interventions.      Vision Vision Assessment?: No apparent visual deficits Additional Comments: Pt able to read words up close and at a distance with no apparent difficulty; denied diplopia, blurriness.  Perception     Praxis      Pertinent Vitals/Pain Pain Assessment: No/denies pain     Hand Dominance     Extremity/Trunk Assessment Upper Extremity Assessment Upper Extremity Assessment: Overall WFL for  tasks assessed   Lower Extremity Assessment Lower Extremity Assessment: Defer to PT evaluation   Cervical / Trunk Assessment Cervical / Trunk Assessment: Normal   Communication Communication Communication: No difficulties   Cognition Arousal/Alertness: Awake/alert Behavior During Therapy: WFL for tasks assessed/performed Overall Cognitive Status: Impaired/Different from baseline Area of Impairment: Orientation;Memory;Following commands Orientation Level: Situation   Memory: Decreased short-term memory Following Commands: Follows one step commands consistently;Follows multi-step commands inconsistently Safety/Judgement: Decreased awareness of safety;Decreased awareness of deficits     General Comments: Pt initially stating that he did not know the date but when asked the year and the month pt able to state correctly. Pt asking "Why am I here at the hospital?"   General Comments       Exercises       Shoulder Instructions      Home Living Family/patient expects to be discharged to:: Private residence Living Arrangements: Spouse/significant other;Children Available Help at Discharge: Family;Available PRN/intermittently Type of Home: House Home Access: Stairs to enter Entergy CorporationEntrance Stairs-Number of Steps: 3 Entrance Stairs-Rails: Right Home Layout: One level     Bathroom Shower/Tub: Runner, broadcasting/film/videoWalk-in shower         Home Equipment: None   Additional Comments: Works 2 jobs: factory working with Designer, 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       Prior Functioning/Environment Level of Independence: Independent                 OT Problem List: Impaired balance (sitting and/or standing);Decreased activity tolerance;Decreased cognition;Decreased knowledge of use of DME or AE;Decreased knowledge of precautions   OT Treatment/Interventions: Self-care/ADL training;DME and/or AE instruction;Therapeutic activities;Cognitive  remediation/compensation;Patient/family education;Balance training    OT Goals(Current goals can be found in the care plan section) Acute Rehab OT Goals Patient Stated Goal: return home OT Goal Formulation: With patient/family Time For Goal Achievement: 01/19/16 Potential to Achieve Goals: Good ADL Goals Pt Will Perform Grooming: standing;with supervision (including gathering needed items) Pt Will Perform Lower Body Bathing: with modified independence;sit to/from stand Pt Will Perform Lower Body Dressing: with modified independence;sit to/from stand Pt Will Perform Tub/Shower Transfer: Shower transfer;with supervision;ambulating;3 in 1 Additional ADL Goal #1: Pt will follow 2-step commands to complete simple ADL activity with 80% accuracy at supervision level.   OT Frequency: Min 2X/week   Barriers to D/C:    Pt's spouse identified her mother as person who could stay with him during the day.       Co-evaluation              End of Session    Activity Tolerance: Patient tolerated treatment well Patient left: in bed;with call bell/phone within reach;with bed alarm set;with family/visitor present   Time: [id number] OT Time Calculation (min): 22 min Charges:  OT General Charges $OT Visit: 1 Procedure OT Evaluation $OT Eval Low Complexity: 1 Procedure G-Codes:    Pilar GrammesMathews, Deaun Rocha H 01/05/2016, 10:34 AM

## 2016-01-05 NOTE — Progress Notes (Signed)
Neurology Progress Note  Subjective: His short-term memory is unchanged and remains severely impaired. He says that I look familiar today (this is the first time he has said so) but he does not know where or when he has seen me before. He asks repetitive questions over the span of 1-2 minutes. He has no specific complaints. He has not eaten, still has no appetite. He has been seen by PT and OT, both of whom have recommended outpatient services to help improve safety.   Current Meds:   Current Facility-Administered Medications:  .  [COMPLETED] sodium chloride 0.9 % bolus 1,000 mL, 1,000 mL, Intravenous, Once, Stopped at 01/02/16 1156 **AND** 0.9 %  sodium chloride infusion, , Intravenous, Continuous, Ripudeep K Rai, MD, Last Rate: 75 mL/hr at 01/04/16 1249 .  enoxaparin (LOVENOX) injection 40 mg, 40 mg, Subcutaneous, Q24H, Clanford L Johnson, MD, 40 mg at 01/04/16 2134 .  ibuprofen (ADVIL,MOTRIN) tablet 400 mg, 400 mg, Oral, Q6H PRN, Clanford Cyndie MullL Johnson, MD .  levETIRAcetam (KEPPRA) tablet 500 mg, 500 mg, Oral, BID, Ripudeep K Rai, MD .  nicotine (NICODERM CQ - dosed in mg/24 hours) patch 14 mg, 14 mg, Transdermal, Daily PRN, Clanford L Johnson, MD .  sodium chloride flush (NS) 0.9 % injection 3 mL, 3 mL, Intravenous, Q12H, Clanford L Johnson, MD, 3 mL at 01/02/16 2256 .  [START ON 01/06/2016] thiamine (VITAMIN B-1) tablet 100 mg, 100 mg, Oral, Daily, Ripudeep K Rai, MD .  zolpidem (AMBIEN) tablet 5 mg, 5 mg, Oral, QHS PRN,MR X 1, Clanford L Johnson, MD  Objective:  Temp:  [97.7 F (36.5 C)-98.5 F (36.9 C)] 98.2 F (36.8 C) (10/01 1030) Pulse Rate:  [63-81] 77 (10/01 1030) Resp:  [2-16] 2 (10/01 1030) BP: (106-132)/(69-82) 106/69 (10/01 1030) SpO2:  [97 %-100 %] 97 % (10/01 1030)  General: WDWN Caucasian man in NAD. Alert, oriented to self, place, and year. Speech is clear without dysarthria. Affect is bright. Comportment is normal. Short-term memory is severely impaired.  HEENT: Neck is  supple without lymphadenopathy. Mucous membranes are moist and the oropharynx is clear. Sclerae are anicteric. There is no conjunctival injection.  CV: Regular, no murmur. Carotid pulses are 2+ and symmetric with no bruits. Distal pulses 2+ and symmetric.  Lungs: CTAB  Extremities: No C/C/E. Neuro: MS: As noted above. No aphasia.  CN: Pupils are equal and reactive from 3-->2 mm bilaterally. EOMI, no nystagmus. Facial sensation is intact to light touch. Face is symmetric at rest with normal strength and mobility. Hearing is intact to conversational voice. Voice is normal in tone and quality. Palate elevates symmetrically. Uvula is midline. Bilateral SCM and trapezii are 5/5. Tongue is midline with normal bulk and mobility.  Motor: Normal bulk, tone, and strength throughout. No pronator drift. No tremor or other abnormal movements are observed.  Sensation: Intact to light touch, pinprick, vibration, and joint position.  DTRs: 2+, symmetric. Toes are downgoing bilaterally. No pathological reflexes.  Coordination: Finger-to-nose and heel-to-shin are without dysmetria bilaterally.    Labs: Lab Results  Component Value Date   WBC 4.5 01/05/2016   HGB 13.4 01/05/2016   HCT 37.2 (L) 01/05/2016   PLT 206 01/05/2016   GLUCOSE 96 01/04/2016   ALT 282 (H) 01/04/2016   AST 98 (H) 01/04/2016   NA 142 01/04/2016   K 3.9 01/04/2016   CL 112 (H) 01/04/2016   CREATININE 0.64 01/04/2016   BUN <5 (L) 01/04/2016   CO2 20 (L) 01/04/2016   TSH 1.054  01/03/2016   INR 1.03 01/02/2016   CBC Latest Ref Rng & Units 01/05/2016 01/04/2016 01/03/2016  WBC 4.0 - 10.5 K/uL 4.5 4.5 5.7  Hemoglobin 13.0 - 17.0 g/dL 81.1 12.9(L) 11.5(L)  Hematocrit 39.0 - 52.0 % 37.2(L) 37.6(L) 34.0(L)  Platelets 150 - 400 K/uL 206 189 187    No results found for: HGBA1C Lab Results  Component Value Date   ALT 282 (H) 01/04/2016   AST 98 (H) 01/04/2016   ALKPHOS 56 01/04/2016   BILITOT 1.0 01/04/2016   Ammonia  57-->31 AST  151-->112-->98 ALT 415-->298-->282 CSF HSV PCR negative  CSF cx NGTD  RPR nonreactive HIV Ab nonreactive HCV Ab >11.0 HBsAg negative Hep A IgM negative HBcIgM negative CSF cx negative at 2 days   Pending labs:  CSF arbovirus panel CSF Lyme titers CSF IgG index RMSF serologies Ehrlichia antibody panel   Radiology:  No new imaging.   A/P:   1. Probable seizure: History is concerning for prolonged seizure that was unwitnessed. MRI brain would support this with bilateral hyperintensity and restricted diffusion in the hippocampi. Etiology for his seizure is unclear but strongly suspect unknown ingestion/intoxication given h/o polysubstance abuse and positive TUDS. LP did not show evidence of infection or inflammation. CSF HSV PCR was negative and acyclovir can be discontinued. Awaiting CSF arbovirus panel, and CSF Lyme titers. Rickettsial infection is possible and Ehrlichia Ab panel and RMSF serologies are pending. He has elevated LFTs but no other significant metabolic derangements. Hepatitis C antibodies are positive; while HCV infection is associated with several neurologic complications, seizures are not typical. Continue Keppra, recommend treatment for four months. If he is seizure-free over that interval, then could consider stopping Keppra at that time. He will need outpatient neurology follow up. Seizure precautions.   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 Treatment is supportive. Avoid CNS active medications, particularly benzos, opiates, and anything with strong anticholinergic properties. Benzos should be reserved for seizure activity lasting longer than two minutes. Appreciate PT and OT help. His memory loss poses significant potential safety risks. I would have some concerns about him being left alone at home for extended periods at this point. He will need someone to help monitor his  medications.  Unfortunately, his memory loss will make it easier for others to take advantage of him if he is not careful. It may also increase risk of overdose in the setting of IV drug abuse.   His brother and sister-in-law are present at the bedside. I discussed the patient's situation with them and answered their questions to the best of my ability. I did not discuss his substance abuse or that he tested positive for hepatitis C as I did not have the opportunity to get the patient's permission to do so ahead of time.   The patient is in agreement with the plan as noted above. Case was discussed with Dr. Isidoro Donning via telephone.  At this time I will sign off. Please call if there are any new questions or concerns.   Rhona Leavens, MD [facility]

## 2016-01-05 NOTE — Progress Notes (Signed)
[facility]                                                                              Patient Demographics  Marco Lynch, is a 35 y.o. male, DOB - 1980/07/25, ZOX:[id number]  Admit date - 01/02/2016   Admitting Physician Cleora Fleet, MD  Outpatient Primary MD for the patient is No primary care provider on file.  Outpatient specialists:   LOS - 2  days    Chief Complaint  Patient presents with  . Loss of Consciousness    Patient does not recall falling; last thing remembered was being in the shower; patient cannot recall day of the week, alert to self and location.       Brief summary   Marco Lynch is a 35 y.o. male smoker with history of cocaine and heroin presented to [facility] ED after he reportedly passed out while he was in the shower but does not recall what really happened. He stated that he had a mild headache he does not recall getting up and getting dressed or arriving at the hospital. He had been confused about the day of the week. He does not have a history of epilepsy but there was some concern he may have had a seizure. He had a normal CT of the brain. However, he had an MRI of the brain that was concerning for possible seizure. Neurology was consult treated and they requested to evaluate the patient at [facility]. The patient denied having chest pain nausea vomiting shortness of breath. He had an essentially normal neurological exam. He was given a dose of Keppra in the emergency department. The patient denied any recent recreational drug use. He denied alcohol use.   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 EEG was normal but recommended prolonged EEG if any further questions - Unremarkable LP,  neurology also ordered CSF studies with Lyme titers, arbovirus panel, HSV, RMSF, ehrlichia, so far pending  -  hepatitis panel negative. Ammonia level normal. continue seizure precautions  - Continue Keppra. HSV PCR neg, IV acyclovir was discontinued  - PT, OT, speech for cognitive function  Transaminitis - Per patient no prior history of abnormal liver enzymes, right upper quadrant ultrasound showed no acute cholecystitis. - hepatitis level positive for hep C  Acute encephalopathy appears to have anterograde amnesia  - Currently improving, neurology, and supportive treatment, - Monitor for any benzo withdrawal, alcohol withdrawal.  Nicotine abuse - Patient counseled on smoking cessation, nicotine patch placed  Code Status:full  DVT Prophylaxis:  Lovenox  Family Communication: Discussed in detail with the patient, all imaging results, lab results explained to the patient and his wife. Discussed with patient's wife in detail she is working until 9 PM tonight. Patient cannot be left alone in the light of his cognitive dysfunction and seizures. For the safe disposition, requested patient's wife to make arrangements of supervision at home for the patient. Will DC home in a.m.,  patient will need neurology follow-up and recommended community wellness center follow-up.     Disposition Plan:   Time Spent in minutes  25 minutes  Procedures:  MRI brain Lumbar puncture  EEG   Consults :   Neurology   Antimicrobials :      Medications  Scheduled Meds: . enoxaparin (LOVENOX) injection  40 mg Subcutaneous Q24H  . levETIRAcetam  500 mg Oral BID  . sodium chloride flush  3 mL Intravenous Q12H  . [START ON 01/06/2016] thiamine  100 mg Oral Daily   Continuous Infusions: . sodium chloride 75 mL/hr at 01/04/16 1249   PRN Meds:.ibuprofen, nicotine, zolpidem   Antibiotics   Anti-infectives    Start     Dose/Rate Route Frequency Ordered Stop   01/02/16 2300  acyclovir (ZOVIRAX) 730 mg  in dextrose 5 % 150 mL IVPB  Status:  Discontinued     730 mg 164.6 mL/hr over 60 Minutes Intravenous Every 8 hours 01/02/16 2218 01/04/16 0849        Subjective:   Yakub Lodes was seen and examined today.  No acute complaints. Patient denies dizziness, chest pain, shortness of breath, abdominal pain, N/V/D/C, new weakness, numbess, tingling. no fevers Wife and daughter at the bedside. No seizure activity during the hospitalization.  Objective:   Vitals:   01/04/16 2103 01/05/16 0104 01/05/16 0632 01/05/16 1030  BP: 127/74 123/82 132/81 106/69  Pulse: 72 68 77 77  Resp: 16 16 16  (!) 2  Temp: 98 F (36.7 C) 97.8 F (36.6 C) 97.7 F (36.5 C) 98.2 F (36.8 C)  TempSrc: Oral Oral Oral Oral  SpO2: 98% 99% 98% 97%  Weight:      Height:        Intake/Output Summary (Last 24 hours) at 01/05/16 1321 Last data filed at 01/04/16 1729  Gross per 24 hour  Intake              240 ml  Output                0 ml  Net              240 ml     Wt Readings from Last 3 Encounters:  01/02/16 81.3 kg (179 lb 3.2 oz)     Exam  General: Alert and oriented x 2, NAD, somewhat confused  HEENT:  .   Neck: Supple, no JVD  Cardiovascular: S1 S2 clear, RRR  Respiratory: CTAB  Gastrointestinal: Soft, nontender, nondistended, + bowel sounds  Ext: no cyanosis clubbing or edema  Neuro: AAOx3, Cr N's II- XII. Strength 5/5 upper and lower extremities bilaterally  Skin: No rashes  Psych: appears to be somewhat confused   Data Reviewed:  I have personally reviewed following labs and imaging studies  Micro Results Recent Results (from the past 240 hour(s))  Urine culture     Status: None   Collection Time: 01/02/16 10:45 AM  Result Value Ref Range Status   Specimen Description URINE, CLEAN CATCH  Final   Special Requests unknown Normal  Final   Culture NO GROWTH Performed at [facility]   Final   Report Status 01/03/2016 FINAL  Final  CSF culture     Status: None  (Preliminary result)   Collection Time: 01/02/16 10:55 PM  Result Value Ref Range Status   Specimen Description CSF  Final   Special Requests NONE  Final   Gram Stain NO WBC SEEN NO ORGANISMS SEEN   Final  Culture NO GROWTH 2 DAYS  Final   Report Status PENDING  Incomplete    Radiology Reports Dg Chest 2 View  Result Date: 01/02/2016 CLINICAL DATA:  Fall in shower this morning hitting head with altered level of consciousness. EXAM: CHEST  2 VIEW COMPARISON:  10/04/2014 FINDINGS: Lungs are adequately inflated without consolidation or effusion. No pneumothorax. Cardiomediastinal silhouette is within normal. Remaining bones and soft tissues are within normal. IMPRESSION: No acute findings. Electronically Signed   By: Elberta Fortisaniel  Boyle M.D.   On: 01/02/2016 10:14   Ct Head Wo Contrast  Result Date: 01/02/2016 CLINICAL DATA:  35 year old male with loss of consciousness, with fall in the shower, and confusion since yesterday. EXAM: CT HEAD WITHOUT CONTRAST TECHNIQUE: Contiguous axial images were obtained from the base of the skull through the vertex without intravenous contrast. COMPARISON:  No priors. FINDINGS: Brain: No evidence of acute infarction, hemorrhage, hydrocephalus, extra-axial collection or mass lesion/mass effect. Vascular: No hyperdense vessel or unexpected calcification. Skull: Normal. Negative for fracture or focal lesion. Sinuses/Orbits: No acute finding. Other: None. IMPRESSION: 1. No acute intracranial abnormalities. 2. The appearance of the brain is normal. Electronically Signed   By: Trudie Reedaniel  Entrikin M.D.   On: 01/02/2016 10:04   Mr Brain Wo Contrast (neuro Protocol)  Result Date: 01/02/2016 CLINICAL DATA:  Fall in shower. Altered mental status with transient global amnesia. CVA versus seizure. EXAM: MRI HEAD WITHOUT CONTRAST TECHNIQUE: Multiplanar, multiecho pulse sequences of the brain and surrounding structures were obtained without intravenous contrast. COMPARISON:  Head CT  from earlier today FINDINGS: Brain: Both hippocampi by are mildly swollen and are T2/FLAIR hyperintense with DWI hyperintensity (likely combination of shine through and true restriction based on ADC map). This is consistent with seizure phenomenon. Encephalitis is not likely given the asymmetric limited appearance and clinical status. No other finding to explain a seizure focus. No asymmetric appearance to suggest a focal underlying mass. Diffusion changes in hippocampi are usually reversible in seizure; no suspected infarct. No hemorrhage, hydrocephalus, or shift. Vascular: Normal flow voids. Skull and upper cervical spine: Normal marrow signal. Sinuses/Orbits: Negative. Other: None. IMPRESSION: Symmetric signal abnormality in the hippocampi consistent with seizure phenomenon. Elsewhere negative brain. Electronically Signed   By: Marnee SpringJonathon  Watts M.D.   On: 01/02/2016 15:02   [place] Abdomen Limited Ruq  Result Date: 01/03/2016 CLINICAL DATA:  Inpatient. Abnormal liver function tests. Polysubstance abuse. EXAM: US ABDOMEN LIMITED - RIGHT UPPER QUADRANT COMPARISON:  None. FINDINGS: Gallbladder: Nondistended gallbladder. No gallstones. Mild layering sludge. Upper normal gallbladder wall thickness (3 mm). No pericholecystic fluid or sonographic Murphy's sign. Common bile duct: Diameter: 4 mm Liver: No liver mass. Liver parenchymal echotexture and echogenicity are within normal limits. There is nonspecific increased echogenicity throughout the portal triads (without dirty shadowing demonstrated to suggest gas). IMPRESSION: 1. Mild gallbladder sludge. No cholelithiasis. No evidence of acute cholecystitis. 2. No biliary ductal dilatation. 3. Nonspecific increased echogenicity throughout the portal triads, which can be seen in the setting of acute hepatitis. Electronically Signed   By: Delbert PhenixJason A Poff M.D.   On: 01/03/2016 16:18    Lab Data:  CBC:  Recent Labs Lab 01/02/16 1045 01/03/16 0041 01/04/16 0828  01/05/16 0809  WBC 12.9* 5.7 4.5 4.5  NEUTROABS 9.7*  --   --   --   HGB 13.7 11.5* 12.9* 13.4  HCT 38.9* 34.0* 37.6* 37.2*  MCV 90.9 93.2 90.8 88.6  PLT 236 187 189 206   Basic Metabolic Panel:  Recent Labs Lab  01/02/16 1045 01/03/16 0041 01/04/16 0651  NA 138 139 142  K 3.6 3.2* 3.9  CL 102 106 112*  CO2 27 25 20*  GLUCOSE 112* 121* 96  BUN 13 10 <5*  CREATININE 0.77 0.71 0.64  CALCIUM 9.3 8.5* 8.8*   GFR: Estimated Creatinine Clearance: 134.3 mL/min (by C-G formula based on SCr of 0.64 mg/dL). Liver Function Tests:  Recent Labs Lab 01/02/16 1045 01/03/16 0041 01/04/16 0651  AST 151* 112* 98*  ALT 415* 298* 282*  ALKPHOS 85 55 56  BILITOT 0.9 0.9 1.0  PROT 8.2* 5.8* 6.2*  ALBUMIN 4.6 3.3* 3.6   No results for input(s): LIPASE, AMYLASE in the last 168 hours.  Recent Labs Lab 01/03/16 0041 01/04/16 0948  AMMONIA 57* 31   Coagulation Profile:  Recent Labs Lab 01/02/16 1045  INR 1.03   Cardiac Enzymes: No results for input(s): CKTOTAL, CKMB, CKMBINDEX, TROPONINI in the last 168 hours. BNP (last 3 results) No results for input(s): PROBNP in the last 8760 hours. HbA1C: No results for input(s): HGBA1C in the last 72 hours. CBG:  Recent Labs Lab 01/02/16 0913 01/03/16 0534 01/04/16 0611  GLUCAP 127* 99 102*   Lipid Profile: No results for input(s): CHOL, HDL, LDLCALC, TRIG, CHOLHDL, LDLDIRECT in the last 72 hours. Thyroid Function Tests:  Recent Labs  01/03/16 0041  TSH 1.054   Anemia Panel: No results for input(s): VITAMINB12, FOLATE, FERRITIN, TIBC, IRON, RETICCTPCT in the last 72 hours. Urine analysis:    Component Value Date/Time   COLORURINE YELLOW 01/02/2016 1045   APPEARANCEUR CLEAR 01/02/2016 1045   LABSPEC 1.020 01/02/2016 1045   PHURINE 6.0 01/02/2016 1045   GLUCOSEU 250 (A) 01/02/2016 1045   HGBUR NEGATIVE 01/02/2016 1045   BILIRUBINUR NEGATIVE 01/02/2016 1045   KETONESUR NEGATIVE 01/02/2016 1045   PROTEINUR 30 (A)  01/02/2016 1045   UROBILINOGEN 0.2 10/04/2014 0351   NITRITE NEGATIVE 01/02/2016 1045   LEUKOCYTESUR NEGATIVE 01/02/2016 1045     RAI,RIPUDEEP M.D. [facility] 01/05/2016, 1:21 PM  Pager: [phone] Between 7am to 7pm - call Pager - [phone]  After 7pm go to [email or web address] - password [email or web address]  Call night coverage person covering after 7pm

## 2016-01-06 LAB — EHRLICHIA ANTIBODY PANEL
E CHAFFEENSIS AB, IGG: NEGATIVE
E chaffeensis (HGE) Ab, IgM: NEGATIVE
E. Chaffeensis (HME) IgM Titer: NEGATIVE
E.Chaffeensis (HME) IgG: NEGATIVE

## 2016-01-06 LAB — CSF CULTURE W GRAM STAIN: Culture: NO GROWTH

## 2016-01-06 LAB — CBC
HEMATOCRIT: 39.9 % (ref 39.0–52.0)
HEMOGLOBIN: 14.1 g/dL (ref 13.0–17.0)
MCH: 31.4 pg (ref 26.0–34.0)
MCHC: 35.3 g/dL (ref 30.0–36.0)
MCV: 88.9 fL (ref 78.0–100.0)
Platelets: 226 10*3/uL (ref 150–400)
RBC: 4.49 MIL/uL (ref 4.22–5.81)
RDW: 12.3 % (ref 11.5–15.5)
WBC: 5.1 10*3/uL (ref 4.0–10.5)

## 2016-01-06 LAB — CSF CULTURE: GRAM STAIN: NONE SEEN

## 2016-01-06 LAB — IGG CSF INDEX
ALBUMIN CSF-MCNC: 13 mg/dL (ref 11–48)
Albumin: 3.5 g/dL (ref 3.5–5.5)
IGG (IMMUNOGLOBIN G), SERUM: 933 mg/dL (ref 700–1600)
IGG INDEX CSF: 0.5 (ref 0.0–0.7)
IGG/ALB RATIO, CSF: 0.12 (ref 0.00–0.25)
IgG, CSF: 1.6 mg/dL (ref 0.0–8.6)

## 2016-01-06 LAB — GLUCOSE, CAPILLARY: Glucose-Capillary: 105 mg/dL — ABNORMAL HIGH (ref 65–99)

## 2016-01-06 MED ORDER — UNABLE TO FIND: 0 refills | Status: DC

## 2016-01-06 NOTE — Care Management Note (Signed)
Case Management Note  Patient Details  Name: Marco Lynch MRN: [id number] Date of Birth: 06/02/1980  Subjective/Objective:                    Action/Plan: CM met with the patient and his wife prior to discharge. Wife already has MATCH letter from weekend CM. CM went over again how the letter works and gave her the updated list of MATCH pharmacies. Pt has 3 in 1 in the room. CM called and spoke to outpatient therapy at [facility] and they had the referral for PT but not OT and Dr Rai added ST. CM placed the orders for outpatient OT/ST in EPIC and updated the patients wife. Patients wife had information on [facility] but asking to be set up with [facility]. CM called and was able to obtain him an appointment for the 16th. Information placed on the AVS and given to the wife. Pt's wife also asking about a letter from Dr Rai for the patients job. Dr Rai had it ready in the letters section. Letter printed and given to the patient's wife. Bedside RN updated.   Expected Discharge Date:   (unknown)               Expected Discharge Plan:  Home/Self Care  In-House Referral:     Discharge planning Services  CM Consult, MATCH Program, Indigent Health Clinic, Medication Assistance  Post Acute Care Choice:    Choice offered to:  Patient  DME Arranged:  3-N-1 DME Agency:  [facility]  HH Arranged:  NA HH Agency:  NA  Status of Service:  Completed, signed off  If discussed at Long Length of Stay Meetings, dates discussed:    Additional Comments:  Kelli F Willard, RN 01/06/2016, 4:11 PM  

## 2016-01-06 NOTE — Progress Notes (Signed)
Occupational Therapy Treatment Patient Details Name: Marco MedinaJason Lynch MRN: [id number] DOB: 11/04/1980 Today's Date: 01/06/2016    History of present illness Pt is a 35 y/o male admitted to the ED for reported loss of consciousness while in the shower. He has no memory of the event. Pt is a current smoker and substance abuse (cocaine and heroin). No additional PMH.   OT comments  Pt completed a shower with brother present. Pt able to complete adls as min guard level and adequate level for d/c home. Pt with short term memory deficits and will need family (A).    Follow Up Recommendations  Supervision/Assistance - 24 hour;Outpatient OT    Equipment Recommendations  3 in 1 bedside comode    Recommendations for Other Services      Precautions / Restrictions Precautions Precautions: Fall       Mobility Bed Mobility Overal bed mobility: Modified Independent                Transfers Overall transfer level: Needs assistance   Transfers: Sit to/from Stand Sit to Stand: Min guard              Balance                                   ADL Overall ADL's : Needs assistance/impaired   Eating/Feeding Details (indicate cue type and reason): declined breakfast this am. SLP to help patient order lunch Grooming: Wash/dry face;Wash/dry hands;Min guard;Sitting   Upper Body Bathing: Min guard;Standing   Lower Body Bathing: Min guard;Sit to/from stand   Upper Body Dressing : Min guard;Sitting   Lower Body Dressing: Min guard;Sit to/from stand   Toilet Transfer: Min guard       Tub/ Engineer, structuralhower Transfer: Min guard   Functional mobility during ADLs: Min guard General ADL Comments: Pt with x1 LOB entering shower but able to self correct LOB      Vision                     Perception     Praxis      Cognition   Behavior During Therapy: WFL for tasks assessed/performed Overall Cognitive Status: Impaired/Different from baseline   Orientation  Level: Situation   Memory: Decreased short-term memory          General Comments: pt with very flat affect     Extremity/Trunk Assessment               Exercises     Shoulder Instructions       General Comments      Pertinent Vitals/ Pain       Pain Assessment: No/denies pain  Home Living                                          Prior Functioning/Environment              Frequency  Min 2X/week        Progress Toward Goals  OT Goals(current goals can now be found in the care plan section)  Progress towards OT goals: Progressing toward goals  Acute Rehab OT Goals Patient Stated Goal: return home OT Goal Formulation: With patient/family Time For Goal Achievement: 01/19/16 Potential to Achieve Goals: Good ADL Goals Pt Will Perform Grooming: standing;with supervision Pt  Will Perform Lower Body Bathing: with modified independence;sit to/from stand Pt Will Perform Lower Body Dressing: with modified independence;sit to/from stand Pt Will Perform Tub/Shower Transfer: Shower transfer;with supervision;ambulating;3 in 1 Additional ADL Goal #1: Pt will follow 2-step commands to complete simple ADL activity with 80% accuracy at supervision level.   Plan Discharge plan remains appropriate    Co-evaluation                 End of Session     Activity Tolerance Patient tolerated treatment well   Patient Left in chair;with call bell/phone within reach;with chair alarm set;with family/visitor present   Nurse Communication Mobility status;Precautions        Time: 5284-1324 OT Time Calculation (min): 32 min  Charges: OT General Charges $OT Visit: 1 Procedure OT Treatments $Self Care/Home Management : 23-37 mins  [name] 01/06/2016, 9:05 AM   [name]   OTR/L Pager: [phone] Office: [phone] .

## 2016-01-06 NOTE — Discharge Summary (Addendum)
Physician Discharge Summary   Patient ID: Marco MedinaJason Lynch MRN: [id number] DOB/AGE: 34/04/1980 34 y.o.  Admit date: 01/02/2016 Discharge date: 01/06/2016  Primary Care Physician:  No primary care provider on file.  Discharge Diagnoses:     Acute encephalopathy likely due to seizure- improving . Polysubstance abuse . Heroin abuse . Tobacco dependence . Elevated liver enzymes . Transient amnesia . Leukocytosis   Consults:  Neurology, Dr. Roxy Mannsster  Recommendations for Outpatient Follow-up:  1. Patient was explained that he is not allowed to drive for 6 months, no underwater sports or heights or dangerous activities until he is cleared by neurology or his primary care physician.  2. Please repeat CBC/BMET at next visit   DIET: Heart healthy diet    Allergies:  No Known Allergies   DISCHARGE MEDICATIONS: Current Discharge Medication List    START taking these medications   Details  ibuprofen (ADVIL,MOTRIN) 400 MG tablet Take 1 tablet (400 mg total) by mouth every 6 (six) hours as needed for fever, headache, mild pain or moderate pain. Qty: 30 tablet, Refills: 0    levETIRAcetam (KEPPRA) 500 MG tablet Take 1 tablet (500 mg total) by mouth 2 (two) times daily. Qty: 60 tablet, Refills: 3    thiamine (VITAMIN B-1) 100 MG tablet Take 1 tablet (100 mg total) by mouth daily. Qty: 30 tablet, Refills: 3    !! UNABLE TO FIND OUTPATIENT PHYSICAL THERAPY   Diagnosis: Seizure Qty: 1 Mutually Defined, Refills: 0    !! UNABLE TO FIND Outpatient speech, cognitive and language therapy  Diagnosis: Prolonged seizure with possible anoxic brain injury Qty: 1 Mutually Defined, Refills: 0     !! - Potential duplicate medications found. Please discuss with provider.       Brief H and P: For complete details please refer to admission H and P, but in brief Marco CowerJason Lynch a 35 y.o.malesmoker with history of cocaine and heroin [facility] after he reportedly passed out while he  was in the shower but does not recall what really happened. He stated that he had a mild headache he does not recall getting up and getting dressed or arriving at the hospital. He had been confused about the day of the week. He does not have a history of epilepsy but there was some concern he may have had a seizure. He had a normal CT of the brain. However, he had an MRI of the brain that was concerning for possible seizure. Neurology was consult treated and they requested to [facility] the [facility]. The patient denied having chest pain nausea vomiting shortness of breath. He had an essentially normal neurological exam. He was given a dose of Keppra in the emergency department. The patient denied any recent recreational drug use. He denied alcohol use.  Hospital Course:  Anterograde amnesia/acute encephalopathy with possible seizure - Currently improving, MRI of the brain showed symmetric signal abnormality in the hippocampi consistent with seizure phenomena otherwise negative - Neurology recommended checking EEG, ammonia level, B1. EEG was normal but recommended prolonged EEG if any further questions - Unremarkable LP, neurology also ordered CSF studies with Lyme titers, arbovirus panel, HSV, RMSF, ehrlichia, so far pending. RPR negative  -  Ammonia level normal - Continue Keppra. HSV PCR neg, IV acyclovir was discontinued  - PT, OT recommended outpatient PT for neuro rehabilitation, arranged by the case management, outpatient speech therapy for cognitive function - I called the neurology office and have an appointment with Dr. Karel JarvisAquino  on 11/27, patient will be called with earlier appointment if there is any cancellation.  Transaminitis - Per patient no prior history of abnormal liver enzymes, right upper quadrant ultrasound showed no acute cholecystitis. - hepatitis level antibody positive for hep C, patient will need to follow up with PCP and GI referral outpatient  Acute  encephalopathy appears to have anterograde amnesia  - Currently improving, patient was placed on supportive treatment.  Nicotine abuse - Patient counseled on smoking cessation, nicotine patch was placed - Patient reported that he has used drugs including cocaine and heroin in the past, UDS was positive for opiates   Day of Discharge BP 107/78 (BP Location: Right Arm)   Pulse 74   Temp 97.5 F (36.4 C) (Oral)   Resp 20   Ht 5\' 10"  (1.778 m)   Wt 81.3 kg (179 lb 3.2 oz)   SpO2 100%   BMI 25.71 kg/m   Physical Exam: General: Alert and awake oriented not in any acute distress. HEENT: anicteric sclera, pupils reactive to light and accommodation CVS: S1-S2 clear no murmur rubs or gallops Chest: clear to auscultation bilaterally, no wheezing rales or rhonchi Abdomen: soft nontender, nondistended, normal bowel sounds Extremities: no cyanosis, clubbing or edema noted bilaterally Neuro: Cranial nerves II-XII intact, no focal neurological deficits   The results of significant diagnostics from this hospitalization (including imaging, microbiology, ancillary and laboratory) are listed below for reference.    LAB RESULTS: Basic Metabolic Panel:  Recent Labs Lab 01/03/16 0041 01/04/16 0651  NA 139 142  K 3.2* 3.9  CL 106 112*  CO2 25 20*  GLUCOSE 121* 96  BUN 10 <5*  CREATININE 0.71 0.64  CALCIUM 8.5* 8.8*   Liver Function Tests:  Recent Labs Lab 01/03/16 0041 01/04/16 0651  AST 112* 98*  ALT 298* 282*  ALKPHOS 55 56  BILITOT 0.9 1.0  PROT 5.8* 6.2*  ALBUMIN 3.3* 3.6   No results for input(s): LIPASE, AMYLASE in the last 168 hours.  Recent Labs Lab 01/03/16 0041 01/04/16 0948  AMMONIA 57* 31   CBC:  Recent Labs Lab 01/02/16 1045  01/05/16 0809 01/06/16 0657  WBC 12.9*  < > 4.5 5.1  NEUTROABS 9.7*  --   --   --   HGB 13.7  < > 13.4 14.1  HCT 38.9*  < > 37.2* 39.9  MCV 90.9  < > 88.6 88.9  PLT 236  < > 206 226  < > = values in this interval not  displayed. Cardiac Enzymes: No results for input(s): CKTOTAL, CKMB, CKMBINDEX, TROPONINI in the last 168 hours. BNP: Invalid input(s): POCBNP CBG:  Recent Labs Lab 01/04/16 0611 01/06/16 0700  GLUCAP 102* 105*    Significant Diagnostic Studies:  Dg Chest 2 View  Result Date: 01/02/2016 CLINICAL DATA:  Fall in shower this morning hitting head with altered level of consciousness. EXAM: CHEST  2 VIEW COMPARISON:  10/04/2014 FINDINGS: Lungs are adequately inflated without consolidation or effusion. No pneumothorax. Cardiomediastinal silhouette is within normal. Remaining bones and soft tissues are within normal. IMPRESSION: No acute findings. Electronically Signed   By: Elberta Fortis M.D.   On: 01/02/2016 10:14   Ct Head Wo Contrast  Result Date: 01/02/2016 CLINICAL DATA:  35 year old male with loss of consciousness, with fall in the shower, and confusion since yesterday. EXAM: CT HEAD WITHOUT CONTRAST TECHNIQUE: Contiguous axial images were obtained from the base of the skull through the vertex without intravenous contrast. COMPARISON:  No priors. FINDINGS: Brain:  No evidence of acute infarction, hemorrhage, hydrocephalus, extra-axial collection or mass lesion/mass effect. Vascular: No hyperdense vessel or unexpected calcification. Skull: Normal. Negative for fracture or focal lesion. Sinuses/Orbits: No acute finding. Other: None. IMPRESSION: 1. No acute intracranial abnormalities. 2. The appearance of the brain is normal. Electronically Signed   By: Trudie Reed M.D.   On: 01/02/2016 10:04   Mr Brain Wo Contrast (neuro Protocol)  Result Date: 01/02/2016 CLINICAL DATA:  Fall in shower. Altered mental status with transient global amnesia. CVA versus seizure. EXAM: MRI HEAD WITHOUT CONTRAST TECHNIQUE: Multiplanar, multiecho pulse sequences of the brain and surrounding structures were obtained without intravenous contrast. COMPARISON:  Head CT from earlier today FINDINGS: Brain: Both  hippocampi by are mildly swollen and are T2/FLAIR hyperintense with DWI hyperintensity (likely combination of shine through and true restriction based on ADC map). This is consistent with seizure phenomenon. Encephalitis is not likely given the asymmetric limited appearance and clinical status. No other finding to explain a seizure focus. No asymmetric appearance to suggest a focal underlying mass. Diffusion changes in hippocampi are usually reversible in seizure; no suspected infarct. No hemorrhage, hydrocephalus, or shift. Vascular: Normal flow voids. Skull and upper cervical spine: Normal marrow signal. Sinuses/Orbits: Negative. Other: None. IMPRESSION: Symmetric signal abnormality in the hippocampi consistent with seizure phenomenon. Elsewhere negative brain. Electronically Signed   By: Marnee Spring M.D.   On: 01/02/2016 15:02   US Abdomen Limited Ruq  Result Date: 01/03/2016 CLINICAL DATA:  Inpatient. Abnormal liver function tests. Polysubstance abuse. EXAM: US ABDOMEN LIMITED - RIGHT UPPER QUADRANT COMPARISON:  None. FINDINGS: Gallbladder: Nondistended gallbladder. No gallstones. Mild layering sludge. Upper normal gallbladder wall thickness (3 mm). No pericholecystic fluid or sonographic Murphy's sign. Common bile duct: Diameter: 4 mm Liver: No liver mass. Liver parenchymal echotexture and echogenicity are within normal limits. There is nonspecific increased echogenicity throughout the portal triads (without dirty shadowing demonstrated to suggest gas). IMPRESSION: 1. Mild gallbladder sludge. No cholelithiasis. No evidence of acute cholecystitis. 2. No biliary ductal dilatation. 3. Nonspecific increased echogenicity throughout the portal triads, which can be seen in the setting of acute hepatitis. Electronically Signed   By: Delbert Phenix M.D.   On: 01/03/2016 16:18      Disposition and Follow-up: Discharge Instructions    Ambulatory referral to Occupational Therapy    Complete by:  As directed     Please call Andree Elk with the appointment: [phone]   Diet general    Complete by:  As directed    Discharge instructions    Complete by:  As directed    You cannot drive for 6 months unless cleared by neurology outpatient. Please avoid under water sports, swimming, heights until cleared by neurology or your doctor.   Increase activity slowly    Complete by:  As directed        DISPOSITION: home with wife   DISCHARGE FOLLOW-UP Follow-up Information    [facility] .   Specialty:  Rehabilitation Why:  You will receive a call to schedule out patient physical therapy Contact information: [address] [id number] mc [place] [address] [phone]       Van Clines, MD Follow up on 03/02/2016.   Specialty:  Neurology Why:  at 8:45AM. You will receive a call from the office if they have an earlier appointment. Please arrive 15 minutes before your appointment for the paperwork. Contact information: [address] [place] [address] [phone]  [facility] Follow up on 01/20/2016.   Why:  your scheduled time is 3pm. Please arrive 15 min early and bring picture ID and current medications. Contact information: [address] [place] [address] [phone]           Time spent on Discharge:   Signed:   RAI,RIPUDEEP M.D. [facility] 01/06/2016, 10:39 AM Pager: [phone]

## 2016-01-06 NOTE — Evaluation (Signed)
Speech Language Pathology Evaluation Patient Details Name: Marco Lynch MRN: [id number] DOB: 06/28/1980 Today's Date: 01/06/2016 Time: [id number] SLP Time Calculation (min) (ACUTE ONLY): 33 min  Problem List:  Patient Active Problem List   Diagnosis Date Noted  . Abnormal LFTs   . Polysubstance abuse 01/02/2016  . Cocaine abuse 01/02/2016  . Heroin abuse 01/02/2016  . Tobacco dependence 01/02/2016  . Elevated liver enzymes 01/02/2016  . Transient amnesia 01/02/2016  . Question of Seizure 01/02/2016  . Leukocytosis 01/02/2016   Past Medical History:  Past Medical History:  Diagnosis Date  . Current every day smoker   . Polysubstance abuse    Past Surgical History: History reviewed. No pertinent surgical history. HPI:  Pt is a 35 y/o male admitted to the ED for reported loss of consciousness while in the shower. He has no memory of the event. Pt is a current smoker and substance abuse (cocaine and heroin). MRI symmetric signal abnormality in the hippocampi consistent with seizure phenomenonNo additional PMH.   Assessment / Plan / Recommendation Clinical Impression  Pt scored a 20/30 on the [facility](MOCA). Obvious hesitations, delays, difficulty with simple calculations and no recall of 5 words (2/5 with semantic cues). Pt questioning city of employment, stated city of residence- unable to state address. Education provided re: deficits and strategies for compensation. Recommend outpatient ST for faciliation of cognitive abilities for independence.     SLP Assessment  All further Speech Lanaguage Pathology  needs can be addressed in the next venue of care (outpatient)    Follow Up Recommendations  Outpatient SLP    Frequency and Duration           SLP Evaluation Cognition  Overall Cognitive Status: Impaired/Different from baseline Arousal/Alertness: Awake/alert Orientation Level: Oriented X4 Attention: Sustained Sustained Attention: Appears  intact Memory: Impaired Memory Impairment: Storage deficit;Retrieval deficit;Decreased recall of new information;Decreased long term memory;Decreased short term memory Decreased Long Term Memory: Verbal basic Decreased Short Term Memory: Verbal basic Awareness: Impaired Awareness Impairment: Anticipatory impairment Problem Solving: Appears intact Safety/Judgment: Impaired       Comprehension  Auditory Comprehension Overall Auditory Comprehension: Appears within functional limits for tasks assessed Visual Recognition/Discrimination Discrimination: Not tested Reading Comprehension Reading Status: Not tested    Expression Expression Primary Mode of Expression: Verbal Verbal Expression Overall Verbal Expression: Appears within functional limits for tasks assessed Initiation: No impairment Level of Generative/Spontaneous Verbalization: Conversation Repetition: No impairment Naming: No impairment Pragmatics: No impairment Written Expression Dominant Hand: Right Written Expression: Not tested   Oral / Motor  Oral Motor/Sensory Function Overall Oral Motor/Sensory Function: Within functional limits Motor Speech Overall Motor Speech: Appears within functional limits for tasks assessed Respiration: Within functional limits Phonation: Normal Resonance: Within functional limits Articulation: Within functional limitis Intelligibility: Intelligible Motor Planning: Witnin functional limits   GO                    Royce MacadamiaLitaker, Bethel Gaglio Willis 01/06/2016, 9:40 AM  Breck CoonsLisa Willis Gloria Ricardo M.Ed [employer]SLP Pager [phone]

## 2016-01-07 LAB — ROCKY MTN SPOTTED FVR ABS PNL(IGG+IGM)
RMSF IGM: 0.6 {index} (ref 0.00–0.89)
RMSF IgG: POSITIVE — AB

## 2016-01-07 LAB — RMSF, IGG, IFA: RMSF, IGG, IFA: 1:64 {titer} — ABNORMAL HIGH

## 2016-01-07 LAB — VITAMIN B1: Vitamin B1 (Thiamine): 150.1 nmol/L (ref 66.5–200.0)

## 2016-01-14 ENCOUNTER — Ambulatory Visit: Payer: Self-pay | Attending: Internal Medicine | Admitting: Occupational Therapy

## 2016-01-14 DIAGNOSIS — R278 Other lack of coordination: Secondary | ICD-10-CM | POA: Insufficient documentation

## 2016-01-14 DIAGNOSIS — R41841 Cognitive communication deficit: Secondary | ICD-10-CM | POA: Insufficient documentation

## 2016-01-14 DIAGNOSIS — R41844 Frontal lobe and executive function deficit: Secondary | ICD-10-CM | POA: Insufficient documentation

## 2016-01-14 DIAGNOSIS — R41840 Attention and concentration deficit: Secondary | ICD-10-CM | POA: Insufficient documentation

## 2016-01-14 NOTE — Therapy (Signed)
[facility] [place] [facility] [address] [place], [place], [address] Phone: [phone]   Fax:  [phone]  Occupational Therapy Evaluation  Patient Details  Name: Marco Lynch MRN: [id number] Date of Birth: [birth year] Referring Provider: Dr. Isidoro Donning (hospitalist)  Encounter Date: 01/14/2016    Past Medical History:  Diagnosis Date  . Current every day smoker   . Polysubstance abuse     No past surgical history on file.  There were no vitals filed for this visit.      Subjective Assessment - 01/14/16 1437    Subjective  Pt with acute encephalopathy likely as a result of a seizure, presents with cognitive and coordination deficits.   Pertinent History see Epic, hx of polysubstance abuse, HEP C +   Patient Stated Goals work on cognitive skills   Currently in Pain? No/denies           [facility] OT Assessment - 01/14/16 1054      Assessment   Diagnosis acute encephlopathy likely due to seizure   Referring Provider Dr. Isidoro Donning  hospitalist   Onset Date 01/02/16   Prior Therapy acute OT     Precautions   Precautions Other (comment)  memory deficits   Precaution Comments no driving for 6 mons     Balance Screen   Has the patient fallen in the past 6 months Yes   How many times? 1   Has the patient had a decrease in activity level because of a fear of falling?  Yes   Is the patient reluctant to leave their home because of a fear of falling?  No     Home  Environment   Family/patient expects to be discharged to: Private residence   Available Help at Discharge Family   Type of Home House   Home Access Stairs   Home Layout One level   Bathroom Shower/Tub Tub only   Lives With Spouse     Prior Function   Level of Independence Independent     ADL   Eating/Feeding Independent   Upper Body Bathing Supervision/safety   Upper Body Dressing Independent   Lower Body Dressing Independent   Heritage manager Independent     IADL   Shopping Needs to be accompanied on any shopping trip   Light Housekeeping --  has not attempted   Meal Prep Needs to have meals prepared and served   Medication Management Is not capable of dispensing or managing own medication   Financial Management Dependent     Mobility   Mobility Status Independent     Written Expression   Dominant Hand Right   Handwriting 50% legible  cursive is not legible, printing is legibile     Vision - History   Baseline Vision No visual deficits     Vision Assessment   Vision Assessment Vision not tested     Cognition   Overall Cognitive Status Impaired/Different from baseline   Mini Mental State Exam  MOCHA 20/30  deficits-short term memory, orientation, attention to detail   Area of Impairment Attention;Memory;Safety/judgement;Awareness;Problem solving;Orientation   Orientation Level --  day of the week, unsure of waht happened   Current Attention Level Selective   Memory Decreased short-term memory   Safety and Judgement Comments impaired safety awareness   Behaviors Poor frustration tolerance  Angers easily, threw his phone and broke it     Observation/Other Assessments   Standing Functional Reach Test RUE11.5, LUE 11 inches  Sensation   Light Touch Not tested     Coordination   9 Hole Peg Test Right;Left   Right 9 Hole Peg Test 24.56 secs   Left 9 Hole Peg Test 26.47 secs   Coordination mildly decreased bilaterally     ROM / Strength   AROM / PROM / Strength AROM;Strength     AROM   Overall AROM  Within functional limits for tasks performed     Strength   Overall Strength Within functional limits for tasks performed     Hand Function   Right Hand Grip (lbs) 100 lbs   Left Hand Grip (lbs) 95 lbs                         OT Education - 01/14/16 1439    Education provided Yes   Education Details role of OT, suggestions to minimize pt frustration/ acting out, no cooking  at this time, pt was encouraged to perform simple home management with his wife   Person(s) Educated Secretary/administratoratient;Caregiver(s)  mother in law   Comprehension Verbalized understanding          OT Short Term Goals - 01/14/16 1349      OT SHORT TERM GOAL #1   Title I with HEP for coordination. 02/28/16   Time 6   Period Weeks   Status New     OT SHORT TERM GOAL #2   Title Pt/ family will verbalize understanding of compensatory strategies for cognitive/ short term memory deficits.   Time 6   Period Weeks   Status New     OT SHORT TERM GOAL #3   Title Pt will perform home management tasks at a modified independent level.   Time 6   Period Weeks   Status New     OT SHORT TERM GOAL #4   Title Pt will perform basic cooking with supervision and min v.c.   Time 6   Period Weeks   Status New     OT SHORT TERM GOAL #5   Title Pt will perform basic problem solving/ organization tasks with min A.   Time 6   Period Weeks   Status New           OT Long Term Goals - 01/14/16 1351      OT LONG TERM GOAL #1   Title Pt will demonstrate improved fine motor coordination as evidenced by decreasing bilateral 9 hole peg test scores by 3 secs. 04/03/16   Time 12   Period Weeks   Status New     OT LONG TERM GOAL #2   Title Pt will perform moderate complex financial management tasks with supervision/ min v.c.   Time 12   Period Weeks   Status New     OT LONG TERM GOAL #3   Title Pt will perform moderate complex cooking tasks with supervision demonstrating good safety awareness.   Time 12   Period Weeks   Status New     OT LONG TERM GOAL #4   Title Pt will perform  moderate complex organization tasks modified independently.   Time 12   Status New     OT LONG TERM GOAL #5   Title Pt will utilize compensations for decreased short term memory with no more than min v.c.   Time 12   Period Weeks   Status New               Plan -  01/14/16 1334    Clinical Impression  Statement 35 y.o male hospitalized with acute encephalopathy likely due to seizure. Pt had a fall in the shower just prior to his hopsitalization and he does not recall what occurred. Pt was postive for opiates. PMH is significant for heroin and cocaine use, HEP C. Pt lives with his wife and 13 y.o daughter. Pt worked in a Designer, industrial/product for cutting fabrics. Pt can benefit from skilled occupational therapy to maximize functional independence with ADLs/IADLS.   Rehab Potential Good   OT Frequency 2x / week   OT Duration 12 weeks   OT Treatment/Interventions Self-care/ADL training;Moist Heat;Fluidtherapy;DME and/or AE instruction;Patient/family education;Therapeutic exercises;Therapeutic exercise;Therapeutic activities;Cognitive remediation/compensation;Neuromuscular education;Parrafin;Energy conservation;Manual Therapy;Visual/perceptual remediation/compensation   Plan issue HEP for coordination   Consulted and Agree with Plan of Care Patient;Family member/caregiver   Family Member Consulted mother in Social worker.      Patient will benefit from skilled therapeutic intervention in order to improve the following deficits and impairments:  Decreased coordination, Decreased safety awareness, Decreased endurance, Decreased activity tolerance, Impaired UE functional use, Decreased knowledge of use of DME, Decreased balance, Decreased cognition  Visit Diagnosis: Frontal lobe and executive function deficit - Plan: Ot plan of care cert/re-cert  Other lack of coordination - Plan: Ot plan of care cert/re-cert  Attention and concentration deficit - Plan: Ot plan of care cert/re-cert    Problem List Patient Active Problem List   Diagnosis Date Noted  . Abnormal LFTs   . Polysubstance abuse 01/02/2016  . Cocaine abuse 01/02/2016  . Heroin abuse 01/02/2016  . Tobacco dependence 01/02/2016  . Elevated liver enzymes 01/02/2016  . Transient amnesia 01/02/2016  . Question of Seizure 01/02/2016   . Leukocytosis 01/02/2016    RINE,KATHRYN 01/14/2016, 2:40 PM Keene Breath, OTR/L [phone] Phone: [phone] 2:40 PM 01/14/16 [facility] [address] [place], [place], [address] Phone: [phone]   Fax:  [phone]  Name: Francois Elk MRN: [id number] Date of Birth: 1980-12-24

## 2016-01-15 ENCOUNTER — Ambulatory Visit: Payer: Self-pay | Admitting: Occupational Therapy

## 2016-01-15 LAB — ARBOVIRUS PANEL, ~~LOC~~ LAB

## 2016-01-17 LAB — B. BURGDORFI ANTIBODIES, CSF

## 2016-01-20 ENCOUNTER — Ambulatory Visit: Payer: Self-pay | Admitting: Occupational Therapy

## 2016-01-20 ENCOUNTER — Encounter: Payer: Self-pay | Admitting: Family Medicine

## 2016-01-20 ENCOUNTER — Encounter: Payer: Self-pay | Admitting: Occupational Therapy

## 2016-01-20 ENCOUNTER — Encounter: Payer: Self-pay | Admitting: Licensed Clinical Social Worker

## 2016-01-20 ENCOUNTER — Ambulatory Visit: Payer: Self-pay | Attending: Family Medicine | Admitting: Family Medicine

## 2016-01-20 VITALS — BP 112/71 | HR 81 | Temp 98.0°F | Ht 69.0 in | Wt 167.4 lb

## 2016-01-20 DIAGNOSIS — R41840 Attention and concentration deficit: Secondary | ICD-10-CM

## 2016-01-20 DIAGNOSIS — R41844 Frontal lobe and executive function deficit: Secondary | ICD-10-CM

## 2016-01-20 DIAGNOSIS — R51 Headache: Secondary | ICD-10-CM

## 2016-01-20 DIAGNOSIS — R413 Other amnesia: Secondary | ICD-10-CM | POA: Insufficient documentation

## 2016-01-20 DIAGNOSIS — R7989 Other specified abnormal findings of blood chemistry: Secondary | ICD-10-CM

## 2016-01-20 DIAGNOSIS — B1710 Acute hepatitis C without hepatic coma: Secondary | ICD-10-CM

## 2016-01-20 DIAGNOSIS — R569 Unspecified convulsions: Secondary | ICD-10-CM

## 2016-01-20 DIAGNOSIS — Y93E1 Activity, personal bathing and showering: Secondary | ICD-10-CM | POA: Insufficient documentation

## 2016-01-20 DIAGNOSIS — A770 Spotted fever due to Rickettsia rickettsii: Secondary | ICD-10-CM

## 2016-01-20 DIAGNOSIS — S0990XA Unspecified injury of head, initial encounter: Secondary | ICD-10-CM | POA: Insufficient documentation

## 2016-01-20 DIAGNOSIS — R278 Other lack of coordination: Secondary | ICD-10-CM

## 2016-01-20 DIAGNOSIS — R519 Headache, unspecified: Secondary | ICD-10-CM

## 2016-01-20 DIAGNOSIS — G9340 Encephalopathy, unspecified: Secondary | ICD-10-CM | POA: Insufficient documentation

## 2016-01-20 DIAGNOSIS — R945 Abnormal results of liver function studies: Secondary | ICD-10-CM

## 2016-01-20 DIAGNOSIS — W182XXA Fall in (into) shower or empty bathtub, initial encounter: Secondary | ICD-10-CM | POA: Insufficient documentation

## 2016-01-20 MED ORDER — LEVETIRACETAM 500 MG PO TABS
500.0000 mg | ORAL_TABLET | Freq: Two times a day (BID) | ORAL | 3 refills | Status: DC
Start: 2016-01-20 — End: 2016-04-02

## 2016-01-20 NOTE — BH Specialist Note (Signed)
Session Start time: 4:10 pm   End Time: 4:30 pm Total Time:  20 minutes Type of Service: Behavioral Health - Individual/Family Interpreter: No.   Interpreter Name & Language: N/A # [facility] Visits July 2017-June 2018: 1st   SUBJECTIVE: Marco MedinaJason Lynch is a 35 y.o. male  Pt. was referred by Dr. Venetia NightAmao for:  depression and community resources. Pt. reports the following symptoms/concerns: Irritability Duration of problem:  Pt is a poor historian, due to recent short term memory loss. Pt's wife reports pt has hx of depression Severity: Moderate Previous treatment: Pt denies previous treatment   OBJECTIVE: Mood: Anxious & Affect: Appropriate Risk of harm to self or others: Pt denies SI/HI Assessments administered: PHQ-9; GAD-7  LIFE CONTEXT:  Family & Social: Pt receives support from wife, in-laws, and friends. Pt's family resides in [place] School/ Work: Pt is employed (1 year); however, he is unable to work due to medical reasons Self-Care: Pt reports lifting weights. He plans to start exercising again in the near future Life changes: Pt recently hospitalized for medical reasons. As a result, pt is unable to return to work and wife may obtain second job due to finances What is important to pt/family (values): Family   GOALS ADDRESSED:  Obtain community resources to address financial concerns Obtain insurance  INTERVENTIONS: Supportive   ASSESSMENT:  Pt currently experiencing irritability, as a result of multiple seizures. Pt was referred by Dr. Venetia NightAmao for community resources. Pt is a poor historian, due to recent short term memory loss. Pt's wife reports he has hx of depression. Pt reports financial concerns due to being uninsured and recent hospitalization for medical concerns. He has family support. Pt may benefit from scheduling an appointment with [facility]. LCSWA discussed benefits of scheduling appt with [employer] Counselor and provided resources on how to apply for Medicaid and  Disability.     PLAN: 1. F/U with behavioral health clinician: Pt was encouraged to contact LCSWA to schedule behavioral appointments at [facility], if needed. 2. Behavioral Health meds: None reported 3. Behavioral recommendations: Pt encouraged to schedule appointment with Financial Counselor and utilize community resources provided at visit. 4. Referral: [facility] 5. From scale of 1-10, how likely are you to follow plan: 7/10   Bridgett LarssonJasmine D Keone Kamer, MSW, LCSWA  01/20/16 5:36 pm  Warmhandoff:   Warm Hand Off Completed.

## 2016-01-20 NOTE — Patient Instructions (Signed)
  Coordination Activities  Perform the following activities for 25-30 minutes 1-2 times per day with both hand(s).   Rotate ball in fingertips (clockwise and counter-clockwise).  Toss ball between hands.  Toss ball in air and catch with the same hand.  Flip cards 1 at a time as fast as you can.  Deal cards with your thumb (Hold deck in hand and push card off top with thumb).  Pick up coins and place in container or coin bank.  Pick up coins and stack.  Screw together nuts and bolts, then unfasten.   - When picking pennies gets easier you can switch to dimes.   - FOCUS ON SPEED WITH ACCURACY!!!!  You can do most of these relative easily when you slow it down so you want to be very focused on SPEED!!!  - The first time you do these tasks time yourself - don't try and rush just do it at a comfortable speed and see what your time is.  Once you have a baseline time, try and beat your own time each time you do it!

## 2016-01-20 NOTE — Therapy (Signed)
[facility] [address] [place], [place], [address] Phone: [phone]   Fax:  [phone]  Occupational Therapy Treatment  Patient Details  Name: Marco Lynch MRN: [id number] Date of Birth: 1980-06-22 Referring Provider: Dr. Isidoro Donning (hospitalist)  Encounter Date: 01/20/2016      OT End of Session - 01/20/16 1303    Visit Number 1   Number of Visits 24   Date for OT Re-Evaluation 04/03/16   OT Start Time 1147   OT Stop Time 1230   OT Time Calculation (min) 43 min   Activity Tolerance Patient tolerated treatment well      Past Medical History:  Diagnosis Date  . Current every day smoker   . Polysubstance abuse     History reviewed. No pertinent surgical history.  There were no vitals filed for this visit.      Subjective Assessment - 01/20/16 1154    Subjective  I had a slight headache earlier today but it is gone now.    Pertinent History see Epic, hx of polysubstance abuse, HEP C +   Patient Stated Goals work on cognitive skills   Currently in Pain? No/denies                      OT Treatments/Exercises (OP) - 01/20/16 0001      ADLs   ADL Comments Reviewed all goals with pt and wife and answered questions. Pt and wife given written copy of goals given pt's memory deficits - discussed this as potential strategy.      Exercises   Exercises Hand     Hand Exercises   Other Hand Exercises Pt issued HEP for bilateral coordination.  Had pt participate and return demonstrate all activities with repetition for both hands given pt's severe memory loss - discussed use of procedural memory (doing vs didactic info) for retention.  Pt requires increased time to process instructions but able to return demonstrate all activities. Wife present and can cue as needed.                OT Education - 01/20/16 1301    Education provided Yes   Education Details HEP for coordination   Person(s)  Educated Patient;Spouse   Methods Explanation;Demonstration;Tactile cues;Verbal cues;Handout   Comprehension Verbalized understanding;Returned demonstration  wife to cue prn          OT Short Term Goals - 01/20/16 1302      OT SHORT TERM GOAL #1   Title I with HEP for coordination. 02/28/16   Time 6   Period Weeks   Status On-going     OT SHORT TERM GOAL #2   Title Pt/ family will verbalize understanding of compensatory strategies for cognitive/ short term memory deficits.   Time 6   Period Weeks   Status On-going     OT SHORT TERM GOAL #3   Title Pt will perform home management tasks at a modified independent level.   Time 6   Period Weeks   Status On-going     OT SHORT TERM GOAL #4   Title Pt will perform basic cooking with supervision and min v.c.   Time 6   Period Weeks   Status On-going     OT SHORT TERM GOAL #5   Title Pt will perform basic problem solving/ organization tasks with min A.   Time 6   Period Weeks   Status On-going  OT Long Term Goals - 01/20/16 1302      OT LONG TERM GOAL #1   Title Pt will demonstrate improved fine motor coordination as evidenced by decreasing bilateral 9 hole peg test scores by 3 secs. 04/03/16   Time 12   Period Weeks   Status On-going     OT LONG TERM GOAL #2   Title Pt will perform moderate complex financial management tasks with supervision/ min v.c.   Time 12   Period Weeks   Status On-going     OT LONG TERM GOAL #3   Title Pt will perform moderate complex cooking tasks with supervision demonstrating good safety awareness.   Time 12   Period Weeks   Status On-going     OT LONG TERM GOAL #4   Title Pt will perform  moderate complex organization tasks modified independently.   Time 12   Status On-going     OT LONG TERM GOAL #5   Title Pt will utilize compensations for decreased short term memory with no more than min v.c.   Time 12   Period Weeks   Status On-going                Plan - 01/20/16 1302    Clinical Impression Statement Pt with slow progress toward goals. Wife and pt with several appropriate questions today.    Rehab Potential Good   OT Frequency 2x / week   OT Duration 12 weeks   OT Treatment/Interventions Self-care/ADL training;Moist Heat;Fluidtherapy;DME and/or AE instruction;Patient/family education;Therapeutic exercises;Therapeutic exercise;Therapeutic activities;Cognitive remediation/compensation;Neuromuscular education;Parrafin;Energy conservation;Manual Therapy;Visual/perceptual remediation/compensation   Plan     Consulted and Agree with Plan of Care Patient;Family member/caregiver   Family Member Consulted wife      Patient will benefit from skilled therapeutic intervention in order to improve the following deficits and impairments:  Decreased coordination, Decreased safety awareness, Decreased endurance, Decreased activity tolerance, Impaired UE functional use, Decreased knowledge of use of DME, Decreased balance, Decreased cognition  Visit Diagnosis: Frontal lobe and executive function deficit  Other lack of coordination  Attention and concentration deficit    Problem List Patient Active Problem List   Diagnosis Date Noted  . Abnormal LFTs   . Polysubstance abuse 01/02/2016  . Cocaine abuse 01/02/2016  . Heroin abuse 01/02/2016  . Tobacco dependence 01/02/2016  . Elevated liver enzymes 01/02/2016  . Transient amnesia 01/02/2016  . Question of Seizure 01/02/2016  . Leukocytosis 01/02/2016    [name], Kristan Brummitt Halliday, OTR/L 01/20/2016, 1:06 PM  [facility] [address] [place], [place], [address] Phone: [phone]   Fax:  [phone]  Name: Marco Lynch MRN: [id number] Date of Birth: 11/23/1980

## 2016-01-20 NOTE — Patient Instructions (Signed)
Hepatitis C  Hepatitis C is a viral infection of the liver. It can lead to scarring of the liver (cirrhosis), liver failure, or liver cancer. Hepatitis C may go undetected for months or years because people with the infection may not have symptoms, or they may have only mild symptoms.  CAUSES   Hepatitis C is caused by the hepatitis C virus (HCV). The virus can be passed from one person to another through:   Blood.   Contaminated needles, such as those used for tattooing, body piercing, acupuncture, or injecting drugs.   Having unprotected sex with an infected person.   Childbirth.   Blood transfusions or organ transplants done in the [place] before 1992.  RISK FACTORS  Risk factors for hepatitis C include:   Having unprotected sex with an infected person.   Using illegal drugs.  SIGNS AND SYMPTOMS   Symptoms of hepatitis C may include:   Fatigue.   Loss of appetite.   Nausea.   Vomiting.   Abdominal pain.   Dark yellow urine.   Yellowish skin and eyes (jaundice).   Itching of the skin.   Clay-colored bowel movements.   Joint pain.  Symptoms are not always present.   DIAGNOSIS   Hepatitis C is diagnosed with blood tests. Other types of tests may also be done to check how your liver is functioning.  TREATMENT   Your health care provider may perform noninvasive tests or a liver biopsy to help determine the best course of treatment. Treatment for hepatitis C may include one or more medicines. Your health care provider may check you for a recurring infection or other liver conditions every 6-12 months after treatment.  HOME CARE INSTRUCTIONS    Rest as needed.   Take all medicines as directed by your health care provider.   Do not take any medicine unless approved by your health care provider. This includes over-the-counter medicine and birth control pills.   Do not drink alcohol.   Do not have sex until approved by your health care provider.   Do not share toothbrushes, nail clippers,  razors, or needles.  PREVENTION  There is no vaccine for hepatitis C. The only way to prevent the disease is to reduce the risk of exposure to the virus. This may be done by:   Practicing safe sex and using condoms.   Avoiding illegal drugs.  SEEK MEDICAL CARE IF:   You have a fever.   You develop abdominal pain.   You develop dark urine.   You have clay-colored bowel movements.   You develop joint pains.  SEEK IMMEDIATE MEDICAL CARE IF:   You have increasing fatigue or weakness.   You lose your appetite.   You feel nauseous or vomit.   You develop jaundice or your jaundice gets worse.   You bruise or bleed easily.  MAKE SURE YOU:    Understand these instructions.   Will watch your condition.   Will get help right away if you are not doing well or get worse.     This information is not intended to replace advice given to you by your health care provider. Make sure you discuss any questions you have with your health care provider.     Document Released: 03/20/2000 Document Revised: 04/13/2014 Document Reviewed: 07/05/2013  Elsevier Interactive Patient Education 2016 Elsevier Inc.

## 2016-01-20 NOTE — Progress Notes (Signed)
Patient is to schedule appointment for lab visit only. Blood work was attempted.

## 2016-01-21 ENCOUNTER — Telehealth: Payer: Self-pay | Admitting: Family Medicine

## 2016-01-21 ENCOUNTER — Telehealth: Payer: Self-pay | Admitting: Internal Medicine

## 2016-01-21 MED ORDER — DOXYCYCLINE HYCLATE 100 MG PO TABS: 100.0000 mg | ORAL_TABLET | Freq: Two times a day (BID) | ORAL | 0 refills | Status: DC

## 2016-01-21 MED FILL — DOXYCYCLINE 100 MG TABLET: 100 | 20 days supply | Qty: 20 | Fill #0

## 2016-01-21 MED FILL — levETIRAcetam 500 MG TABS: 500 | 30 days supply | Qty: 60 | Fill #0

## 2016-01-21 NOTE — Telephone Encounter (Signed)
Please inform him that one of his labs from hospitalization came back positive for [facility] spotted fever which could be secondary to a tick bite and symptoms could be fevers and arthralgias. I have sent a prescription for doxycycline to the pharmacy in-house.

## 2016-01-21 NOTE — Telephone Encounter (Signed)
Patient's wife called the office because they missed our called. I informed of what PCP stated. Pt's wife has questions regarding the labs. Please follow up.

## 2016-01-21 NOTE — Telephone Encounter (Signed)
Writer call patient's wife per Dr. Venetia NightAmao with the positive [place] spotted fever results.  Patient wife very upset and does not believe result.  Writer copied the test result and informed her that the anti biotic will be no cost to the patient.  Patient to come in for blood work and the antibiotic in the morning.

## 2016-01-21 NOTE — Progress Notes (Signed)
Subjective:  Patient ID: Marco MedinaJason Bedel, male    DOB: 12/24/1980  Age: 35 y.o. MRN: [id number]  CC: amnesia and Seizures   HPI Marco Lynch is a 35 year old male here for a hospital follow up after [facility] between 02/01/16 and 01/06/16 for acute encephalopathy likely due to new onset seizure. He had presented after taking a fall in the shower and was unable to recall the surrounding events. MRI of the brain was concerning for possible seizure.  He was seen by neurology and placed on Keppra; workup by means of lumbar puncture was negative but he tested positive for [facility] spotted fever and also had elevated LFTs; HCV antibody was positive. He was subsequently discharged with recommendation to follow-up with neurology in 03/02/16.  He is accompanied by a male friend today and they are devastated on learning that he tested positive for hepatitis C; he still complains of memory loss, headaches for which he takes ibuprofen. They are requesting a note to be excused from work. He has had no seizures since discharge.  Past Medical History:  Diagnosis Date  . Current every day smoker   . Polysubstance abuse     History reviewed. No pertinent surgical history.  No Known Allergies   Outpatient Medications Prior to Visit  Medication Sig Dispense Refill  . ibuprofen (ADVIL,MOTRIN) 400 MG tablet Take 1 tablet (400 mg total) by mouth every 6 (six) hours as needed for fever, headache, mild pain or moderate pain. 30 tablet 0  . thiamine (VITAMIN B-1) 100 MG tablet Take 1 tablet (100 mg total) by mouth daily. 30 tablet 3  . UNABLE TO FIND OUTPATIENT PHYSICAL THERAPY   Diagnosis: Seizure 1 Mutually Defined 0  . 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 levETIRAcetam (KEPPRA) 500 MG tablet Take 1 tablet (500 mg total) by mouth 2 (two) times daily. 60  tablet 3   No facility-administered medications prior to visit.     ROS Review of Systems  Constitutional: Negative for activity change and appetite change.  HENT: Negative for sinus pressure and sore throat.   Eyes: Negative for visual disturbance.  Respiratory: Negative for cough, chest tightness and shortness of breath.   Cardiovascular: Negative for chest pain and leg swelling.  Gastrointestinal: Negative for abdominal distention, abdominal pain, constipation and diarrhea.  Endocrine: Negative.   Genitourinary: Negative for dysuria.  Musculoskeletal: Negative for joint swelling and myalgias.  Skin: Negative for rash.  Allergic/Immunologic: Negative.   Neurological: Negative for weakness, light-headedness and numbness.  Psychiatric/Behavioral: Negative for dysphoric mood and suicidal ideas.    Objective:  BP 112/71 (BP Location: Right Arm, Patient Position: Sitting, Cuff Size: Small)   Pulse 81   Temp 98 F (36.7 C) (Oral)   Ht 5\' 9"  (1.753 m)   Wt 167 lb 6.4 oz (75.9 kg)   SpO2 97%   BMI 24.72 kg/m   BP/Weight 01/20/2016 01/06/2016 01/02/2016  Systolic BP 112 107 -  Diastolic BP 71 78 -  Wt. (Lbs) 167.4 - 179.2  BMI 24.72 - 25.71      Physical Exam  Constitutional: He is oriented to person, place, and time. He appears well-developed and well-nourished.  Cardiovascular: Normal rate, normal heart sounds and intact distal pulses.   No murmur heard. Pulmonary/Chest: Effort normal and breath sounds normal. He has no wheezes. He has no rales. He exhibits no  tenderness.  Abdominal: Soft. Bowel sounds are normal. He exhibits no distension and no mass. There is no tenderness.  Musculoskeletal: Normal range of motion.  Neurological: He is alert and oriented to person, place, and time.      Lab Results  Component Value Date   HCVAB >11.0 (H) 01/03/2016    CMP Latest Ref Rng & Units 01/04/2016 01/03/2016 01/02/2016  Glucose 65 - 99 mg/dL 96 161(W) 960(A)  BUN 6 - 20  mg/dL <5(W) 10 13  Creatinine 0.61 - 1.24 mg/dL 0.98 1.19 1.47  Sodium 135 - 145 mmol/L 142 139 138  Potassium 3.5 - 5.1 mmol/L 3.9 3.2(L) 3.6  Chloride 101 - 111 mmol/L 112(H) 106 102  CO2 22 - 32 mmol/L 20(L) 25 27  Calcium 8.9 - 10.3 mg/dL 8.2(N) 5.6(O) 9.3  Total Protein 6.5 - 8.1 g/dL 6.2(L) 5.8(L) 8.2(H)  Total Bilirubin 0.3 - 1.2 mg/dL 1.0 0.9 0.9  Alkaline Phos 38 - 126 U/L 56 55 85  AST 15 - 41 U/L 98(H) 112(H) 151(H)  ALT 17 - 63 U/L 282(H) 298(H) 415(H)    CLINICAL DATA:  Fall in shower. Altered mental status with transient global amnesia. CVA versus seizure.  EXAM: MRI HEAD WITHOUT CONTRAST  TECHNIQUE: Multiplanar, multiecho pulse sequences of the brain and surrounding structures were obtained without intravenous contrast.  COMPARISON:  Head CT from earlier today  FINDINGS: Brain: Both hippocampi by are mildly swollen and are T2/FLAIR hyperintense with DWI hyperintensity (likely combination of shine through and true restriction based on ADC map). This is consistent with seizure phenomenon. Encephalitis is not likely given the asymmetric limited appearance and clinical status. No other finding to explain a seizure focus. No asymmetric appearance to suggest a focal underlying mass. Diffusion changes in hippocampi are usually reversible in seizure; no suspected infarct. No hemorrhage, hydrocephalus, or shift.  Vascular: Normal flow voids.  Skull and upper cervical spine: Normal marrow signal.  Sinuses/Orbits: Negative.  Other: None.  IMPRESSION: Symmetric signal abnormality in the hippocampi consistent with seizure phenomenon. Elsewhere negative brain.   Electronically Signed   By: Marnee Spring M.D.   On: 01/02/2016 15:02   Assessment & Plan:   1. Question of Seizure No seizures since discharge Associated memory loss Continue Keppra and advised to keep appointment with Neurology in 02/2016 Advised against driving Written excuse from  work until cleared by Neurology.   2. Acute hepatitis C virus infection without hepatic coma Patient and male companion are devastated by the news that he has has Hepatitis C Will repeat again and refer to Infectious disease if positive Discussed precautions - COMPLETE METABOLIC PANEL WITH GFR - Hepatitis C Ab Reflex HCV RNA, QUANT - Hepatitis C Genotype   3. Elevated LFTs Will repeat CMET Could be secondary to seizure vs Hepatitis C  4. Frequent headaches ? Secondary to head trauma from the fall during his seizure Currently on Ibuprofen   Meds ordered this encounter  Medications  . levETIRAcetam (KEPPRA) 500 MG tablet    Sig: Take 1 tablet (500 mg total) by mouth 2 (two) times daily.    Dispense:  60 tablet    Refill:  3    Follow-up: Return in about 1 month (around 02/20/2016) for coordination of care.   Jaclyn Shaggy MD

## 2016-01-22 ENCOUNTER — Ambulatory Visit: Payer: Self-pay | Attending: Family Medicine

## 2016-01-22 DIAGNOSIS — B1710 Acute hepatitis C without hepatic coma: Secondary | ICD-10-CM | POA: Insufficient documentation

## 2016-01-22 NOTE — Patient Instructions (Signed)
Patient received results confirming RMSF in the hospital. Patient was aware of antibiotic being ordered to the pharmacy. Patient aware of receiving a follow-up call regarding results from today's lab work.

## 2016-01-22 NOTE — Progress Notes (Signed)
Patient denied any alcohol or latex allergy. Patient tolerated blood draw well today with only one stick.

## 2016-01-23 ENCOUNTER — Ambulatory Visit: Payer: Self-pay | Admitting: Occupational Therapy

## 2016-01-23 LAB — COMPLETE METABOLIC PANEL WITH GFR

## 2016-01-23 LAB — HEPATITIS C ANTIBODY

## 2016-01-25 LAB — HEPATITIS C GENOTYPE

## 2016-01-27 NOTE — Telephone Encounter (Signed)
It appears this patient's comprehensive metabolic panel and hepatitis C antibody labs were canceled. Could you please follow-up on this? Thank you.

## 2016-01-28 ENCOUNTER — Ambulatory Visit: Payer: Self-pay | Admitting: Occupational Therapy

## 2016-01-30 ENCOUNTER — Ambulatory Visit: Payer: Self-pay | Admitting: Occupational Therapy

## 2016-01-30 ENCOUNTER — Encounter: Payer: Self-pay | Admitting: Occupational Therapy

## 2016-01-30 DIAGNOSIS — R278 Other lack of coordination: Secondary | ICD-10-CM

## 2016-01-30 DIAGNOSIS — R41844 Frontal lobe and executive function deficit: Secondary | ICD-10-CM

## 2016-01-30 DIAGNOSIS — R41840 Attention and concentration deficit: Secondary | ICD-10-CM

## 2016-01-30 NOTE — Therapy (Signed)
[facility] [address] [place], [place], [address] Phone: [phone]   Fax:  [phone]  Occupational Therapy Treatment  Patient Details  Name: Marco Lynch MRN: [id number] Date of Birth: Jun 10, 1980 Referring Provider: Dr. Isidoro Donning (hospitalist)  Encounter Date: 01/30/2016      OT End of Session - 01/30/16 1201    Visit Number 3   Number of Visits 24   Date for OT Re-Evaluation 04/03/16   Authorization Type self pay   OT Start Time 0845   OT Stop Time 0929   OT Time Calculation (min) 44 min   Activity Tolerance Patient tolerated treatment well      Past Medical History:  Diagnosis Date  . Current every day smoker   . Polysubstance abuse     History reviewed. No pertinent surgical history.  There were no vitals filed for this visit.      Subjective Assessment - 01/30/16 0855    Subjective  I am finding that I am flying off the handle at a drop of a hat.    Pertinent History see Epic, hx of polysubstance abuse, HEP C +   Patient Stated Goals work on cognitive skills   Currently in Pain? Yes   Pain Score 2    Pain Location Head   Pain Orientation Left;Anterior   Pain Descriptors / Indicators Pressure   Pain Type Chronic pain   Pain Onset More than a month ago   Pain Frequency Intermittent   Aggravating Factors  loud noises, stress   Pain Relieving Factors OTC                      OT Treatments/Exercises (OP) - 01/30/16 0001      Cognitive Exercises   Organization Reoriented pt to date and day of the week.  Reviewed with pt diagnosis (major seizure with possible anoxic brain injury). Will continue to review and will put in writing next visit to assist with carry over.  Pt states that he feels his memory is somewhat better (i.e knew he had an appt today but couldn't remember when even tho wife told him time repeatedly). Pt also dicussed issues with temporal management as well as low  frustration level.  Feel pt could benefit from follow up with rehab MD.  Pt open to idea and will contact wife to follow up.  Pt also asking about return to work - pt would be unable at this point due to profound memory deficits, organizational deficits and attentional deficits.  Reviewed example of weekly schedule as pt stated "I do better if I have some structure to my day otherwise its the end of the day and I haven't done a thing."  Will work with pt next session to develop weekly schedule specific to pt.  Also intiated and issued 3 bring binder for use as memory notebook (pt prefers paper and pencil to apps). Pt provided with schedule for therapy appts and calendars for Oct-December.  Had pt work on crossing out days in order to keep track of date and using sticky notes on cover of memory notebook for things occuring in next 24 hours.  Pt will need signficant practice to use and wife will need education on working with pt at home to help structure pt.                  OT Education - 01/30/16 1159    Education provided Yes   Education Details  diagnosis, deficits, initaition of memory notebook   Person(s) Educated Patient   Methods Explanation;Demonstration;Verbal cues;Handout   Comprehension Verbalized understanding;Need further instruction          OT Short Term Goals - 01/30/16 1159      OT SHORT TERM GOAL #1   Title I with HEP for coordination. 02/28/16   Time 6   Period Weeks   Status On-going     OT SHORT TERM GOAL #2   Title Pt/ family will verbalize understanding of compensatory strategies for cognitive/ short term memory deficits.   Time 6   Period Weeks   Status On-going     OT SHORT TERM GOAL #3   Title Pt will perform home management tasks at a modified independent level.   Time 6   Period Weeks   Status On-going     OT SHORT TERM GOAL #4   Title Pt will perform basic cooking with supervision and min v.c.   Time 6   Period Weeks   Status On-going     OT  SHORT TERM GOAL #5   Title Pt will perform basic problem solving/ organization tasks with min A.   Time 6   Period Weeks   Status On-going           OT Long Term Goals - 01/30/16 1200      OT LONG TERM GOAL #1   Title Pt will demonstrate improved fine motor coordination as evidenced by decreasing bilateral 9 hole peg test scores by 3 secs. 04/03/16   Time 12   Period Weeks   Status On-going     OT LONG TERM GOAL #2   Title Pt will perform moderate complex financial management tasks with supervision/ min v.c.   Time 12   Period Weeks   Status On-going     OT LONG TERM GOAL #3   Title Pt will perform moderate complex cooking tasks with supervision demonstrating good safety awareness.   Time 12   Period Weeks   Status On-going     OT LONG TERM GOAL #4   Title Pt will perform  moderate complex organization tasks modified independently.   Time 12   Status On-going     OT LONG TERM GOAL #5   Title Pt will utilize compensations for decreased short term memory with no more than min v.c.   Time 12   Period Weeks   Status On-going               Plan - 01/30/16 1200    Clinical Impression Statement Pt with slow  progress toward goals.  Pt has only had 2 treatment sessions since evaluation   Rehab Potential Good   OT Frequency 2x / week   OT Duration 12 weeks   OT Treatment/Interventions Self-care/ADL training;Moist Heat;Fluidtherapy;DME and/or AE instruction;Patient/family education;Therapeutic exercises;Therapeutic exercise;Therapeutic activities;Cognitive remediation/compensation;Neuromuscular education;Parrafin;Energy conservation;Manual Therapy;Visual/perceptual remediation/compensation   Plan practiced use of memory notebook, start individualized pt schedule    Consulted and Agree with Plan of Care Patient      Patient will benefit from skilled therapeutic intervention in order to improve the following deficits and impairments:  Decreased coordination,  Decreased safety awareness, Decreased endurance, Decreased activity tolerance, Impaired UE functional use, Decreased knowledge of use of DME, Decreased balance, Decreased cognition  Visit Diagnosis: Frontal lobe and executive function deficit  Other lack of coordination  Attention and concentration deficit    Problem List Patient Active Problem List   Diagnosis Date Noted  .  Abnormal LFTs   . Polysubstance abuse 01/02/2016  . Cocaine abuse 01/02/2016  . Heroin abuse 01/02/2016  . Tobacco dependence 01/02/2016  . Elevated liver enzymes 01/02/2016  . Transient amnesia 01/02/2016  . Question of Seizure 01/02/2016  . Leukocytosis 01/02/2016    Norton Pastel, OTR/L 01/30/2016, 12:02 PM  [facility] [address] [place], [place], [address] Phone: [phone]   Fax:  [phone]  Name: Marco Lynch MRN: [id number] Date of Birth: September 18, 1980

## 2016-01-30 NOTE — Patient Instructions (Signed)
Initiated Glass blower/designermemory notebook.

## 2016-01-31 ENCOUNTER — Telehealth: Payer: Self-pay

## 2016-01-31 ENCOUNTER — Ambulatory Visit: Payer: Self-pay

## 2016-01-31 ENCOUNTER — Other Ambulatory Visit: Payer: Self-pay | Admitting: Family Medicine

## 2016-01-31 DIAGNOSIS — R41841 Cognitive communication deficit: Secondary | ICD-10-CM

## 2016-01-31 DIAGNOSIS — B1920 Unspecified viral hepatitis C without hepatic coma: Secondary | ICD-10-CM | POA: Insufficient documentation

## 2016-01-31 DIAGNOSIS — B1921 Unspecified viral hepatitis C with hepatic coma: Secondary | ICD-10-CM

## 2016-01-31 NOTE — Patient Instructions (Signed)
Use your memory notebook: CROSS OFF THE PREVIOUS DAY! Put things to do and your therapy/appointments on a yellow sticky note for the next day, put the next day's date on it, and put it on the front of your notebook. If you want to add something to the day's items, write it on the sticky note  Bring your memory notebook to therapy with you all the time.

## 2016-01-31 NOTE — Telephone Encounter (Signed)
Writer spoke with Ms Marco Lynch and she will bring patient in for redraw on blood work.  Dr Venetia NightAmao to put orders in.

## 2016-01-31 NOTE — Telephone Encounter (Signed)
Patient's mother-n-law asking to please call regarding patient

## 2016-01-31 NOTE — Telephone Encounter (Signed)
Writer called and spoke with Cooper's mother in law regarding the hepatitis c test being incomplete.  She will call patient's wife and explain to her.  Writer offered another appt for a redraw today with the lab.

## 2016-01-31 NOTE — Therapy (Signed)
[facility] [address] [place], [place], [address] Phone: [phone]   Fax:  [phone]  Speech Language Pathology Evaluation  Patient Details  Name: Marco MedinaJason Whisman MRN: [id number] Date of Birth: 09/01/1980 Referring Provider: Thad Rangerai, Ripudeep, M.D.  Encounter Date: 01/31/2016      End of Session - 01/31/16 1539    Visit Number 1   Number of Visits 24   Date for SLP Re-Evaluation 04/03/16   SLP Start Time 1539  pt 8 minutes late   SLP Stop Time  1615   SLP Time Calculation (min) 36 min   Activity Tolerance Patient tolerated treatment well      Past Medical History:  Diagnosis Date  . Current every day smoker   . Polysubstance abuse     No past surgical history on file.  There were no vitals filed for this visit.      Subjective Assessment - 01/31/16 1542    Subjective "That was yesterday (OT appt.)? I thought it was like, last week."   Currently in Pain? No/denies            SLP Evaluation [facility] - 01/31/16 1542      SLP Visit Information   SLP Received On 01/31/16   Referring Provider Thad Rangerai, Ripudeep, M.D.   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     Prior Functional Status   Cognitive/Linguistic Baseline Within functional limits     Cognition   Overall Cognitive Status Impaired/Different from baseline   Area of Impairment Attention;Memory;Safety/judgement;Problem solving;Orientation;Awareness   Orientation Level Situation;Time  Told me month but not date; "Ah, cognitive therapy?"   Current Attention Level Selective   Memory Decreased short-term memory   Safety/Judgement --  pt asking SLP if he can return to work   Awareness Emergent   Problem Solving Slow processing  possible   Behaviors --     Auditory Comprehension   Overall Auditory Comprehension Comments Pt with possible delayed auditory processing.     Verbal Expression   Overall Verbal Expression Appears within functional limits for tasks assessed     Motor Speech   Overall Motor Speech Appears within functional limits for tasks assessed     Standardized Assessments   Standardized Assessments  Other Assessment  Hopkins Verbal Learning   Other Assessment Recall: 6/12 WNL, 7/12 WNL, 6/12 below WNL; recognition: 11/12 below WNL                         SLP Education - 01/31/16 1643    Education provided Yes   Education Details memory book rationale, sections (OT, Speech, Schedule)   Person(s) Educated Patient;Spouse   Methods Explanation;Demonstration   Comprehension Verbalized understanding;Need further instruction          SLP Short Term Goals - 01/31/16 1646      SLP SHORT TERM GOAL #1   Title pt will reach to look for information inside memory notebook 100% success in 3 sessions   Time 4   Period Weeks   Status New     SLP SHORT TERM GOAL #2   Title in response to SLP question, pt will locate information asked of him in notebook within 10 seconds,  90% of the time over three sessions   Time 4   Period Weeks   Status New     SLP SHORT TERM GOAL #3   Title in response to SLP verbal question, pt will tell SLP 4 addtional details of previous event/s since last therapy, using notebook to stimulate his memory   Time 4   Period Weeks   Status New     SLP SHORT TERM GOAL #4   Title pt and/or family will tell SLP pt is using memory notebook at home at least three times daily   Time 4   Period Weeks   Status New          SLP Long Term Goals - 01/31/16 1651      SLP LONG TERM GOAL #1   Title pt will demo independence with use of memory book in all therapy situations   Time 12   Period Weeks   Status New     SLP LONG TERM GOAL #2   Title pt will tell SLP two ways  his memory notebook can help him in daily tasks   Time 12   Period Weeks   Status New          Plan - 01/31/16 1644    Clinical Impression Statement Pt presents with cognitive linguistic deficits primarily in the area of memory/temporal orientation. Pt and wife were educated today on how to use the pt's memory notebook to best benefit pt. He would benefit from skilled ST addressing attention and memory initially, then more complex/abstract cognitive linguistic concepts/areas.    Speech Therapy Frequency 2x / week   Duration --  12 weeks   Treatment/Interventions Patient/family education;SLP instruction and feedback;Environmental controls;Cognitive reorganization;Internal/external aids;Cueing hierarchy;Functional tasks;Compensatory strategies   Potential to Achieve Goals Good   Potential Considerations Financial resources;Severity of impairments      Patient will benefit from skilled therapeutic intervention in order to improve the following deficits and impairments:   Cognitive communication deficit    Problem List Patient Active Problem List   Diagnosis Date Noted  . Hepatitis C 01/31/2016  . Abnormal LFTs   . Polysubstance abuse 01/02/2016  . Cocaine abuse 01/02/2016  . Heroin abuse 01/02/2016  . Tobacco dependence 01/02/2016  . Elevated liver enzymes 01/02/2016  . Transient amnesia 01/02/2016  . Question of Seizure 01/02/2016  . Leukocytosis 01/02/2016    SCHINKE,CARL ,MS, CCC-SLP  01/31/2016, 5:03 PM  [facility] [address] [place], [place], [address] Phone: [phone]   Fax:  [phone]  Name: Jafeth Mustin MRN: [id number] Date of Birth: January 20, 1981

## 2016-01-31 NOTE — Telephone Encounter (Signed)
Writer spoke with patient's

## 2016-02-03 ENCOUNTER — Ambulatory Visit: Payer: Self-pay | Admitting: Occupational Therapy

## 2016-02-06 ENCOUNTER — Ambulatory Visit: Payer: Self-pay | Attending: Internal Medicine | Admitting: *Deleted

## 2016-02-06 ENCOUNTER — Ambulatory Visit: Payer: Self-pay | Admitting: Occupational Therapy

## 2016-02-06 DIAGNOSIS — R41840 Attention and concentration deficit: Secondary | ICD-10-CM

## 2016-02-06 DIAGNOSIS — R41844 Frontal lobe and executive function deficit: Secondary | ICD-10-CM

## 2016-02-06 DIAGNOSIS — R278 Other lack of coordination: Secondary | ICD-10-CM

## 2016-02-06 DIAGNOSIS — R41841 Cognitive communication deficit: Secondary | ICD-10-CM | POA: Insufficient documentation

## 2016-02-06 NOTE — Patient Instructions (Signed)
Discussed a typical day with Barbara CowerJason, and came up with several plans/goals:   1. Have designated places for things that tend to get misplaced.  2. Write information down in memory book - questions asked, appointments, Celeste's work schedule, Abdulkareem's appointments  3. Get a more functional and effective type of memory book - calendar, day runner - look for a functional system that has calendar, blank paper, address book... Whatever meets YOUR needs.  Check out [email or web address] or [employer] and see what you think!  4. Go to the pharmacy of your choice and pick out the best med box for your current medications. You may bring it in with your meds and we can fill it up in therapy once a week if you like, or you and Park MeoCeleste can do it at home.   5. Ask Celeste for a copy of her work schedule. Write it in your planner in (colored?) pencil  6. Write out list of things you can do during the day - chores, planning meals, figuring out how to empty storage unit in [place], etc.

## 2016-02-06 NOTE — Patient Instructions (Signed)
Please bring your memory notebook with you to therapy each time.  Try to use your memory notebook at home, write down a schedule/activities to accomplish for the day each day until therapist can generate printed schedule  If a family member can attend therapy at least 1x week that would help with carryover at home, even if it's only for the first 15 mins or last 15 mins of therapy.

## 2016-02-06 NOTE — Therapy (Signed)
[facility] [address] [place], [place], [address] Phone: [phone]   Fax:  [phone]  Occupational Therapy Treatment  Patient Details  Name: Marco Lynch MRN: [id number] Date of Birth: 07/30/1980 Referring Provider: Dr. Isidoro Donningai (hospitalist)  Encounter Date: 02/06/2016      OT End of Session - 02/06/16 1037    Visit Number 4   Number of Visits 24   Date for OT Re-Evaluation 04/03/16   Authorization Type self pay   OT Start Time 0810  pt late   OT Stop Time 0845   OT Time Calculation (min) 35 min   Activity Tolerance Patient tolerated treatment well      Past Medical History:  Diagnosis Date  . Current every day smoker   . Polysubstance abuse     No past surgical history on file.  There were no vitals filed for this visit.      Subjective Assessment - 02/06/16 1032    Subjective  Pt reports he forgot to bring memory notebook   Pertinent History see Epic, hx of polysubstance abuse, HEP C +   Patient Stated Goals work on cognitive skills   Currently in Pain? No/denies        Treatment: Pt arrived unaccompanied without his memory notebook today. Discussion with pt regarding importance of bringing memory notebook to therapy, family involvement for improved carryover at home. Discussion with pt regarding daily activities and importance of establishing a regular routine. Pt/ therapist discussed what a regular schedule would look like. Therapist to type up for pt.                      OT Education - 02/06/16 1034    Education provided Yes   Education Details importance of bringing memory notebook to therapy, family involvement in tx for carryover, establishing a routine   Person(s) Educated Patient   Methods Explanation;Demonstration;Handout   Comprehension Verbalized understanding          OT Short Term Goals - 01/30/16 1159      OT SHORT TERM GOAL #1   Title I with HEP for  coordination. 02/28/16   Time 6   Period Weeks   Status On-going     OT SHORT TERM GOAL #2   Title Pt/ family will verbalize understanding of compensatory strategies for cognitive/ short term memory deficits.   Time 6   Period Weeks   Status On-going     OT SHORT TERM GOAL #3   Title Pt will perform home management tasks at a modified independent level.   Time 6   Period Weeks   Status On-going     OT SHORT TERM GOAL #4   Title Pt will perform basic cooking with supervision and min v.c.   Time 6   Period Weeks   Status On-going     OT SHORT TERM GOAL #5   Title Pt will perform basic problem solving/ organization tasks with min A.   Time 6   Period Weeks   Status On-going           OT Long Term Goals - 01/30/16 1200      OT LONG TERM GOAL #1   Title Pt will demonstrate improved fine motor coordination as evidenced by decreasing bilateral 9 hole peg test scores by 3 secs. 04/03/16   Time 12   Period Weeks   Status On-going     OT LONG TERM GOAL #2  Title Pt will perform moderate complex financial management tasks with supervision/ min v.c.   Time 12   Period Weeks   Status On-going     OT LONG TERM GOAL #3   Title Pt will perform moderate complex cooking tasks with supervision demonstrating good safety awareness.   Time 12   Period Weeks   Status On-going     OT LONG TERM GOAL #4   Title Pt will perform  moderate complex organization tasks modified independently.   Time 12   Status On-going     OT LONG TERM GOAL #5   Title Pt will utilize compensations for decreased short term memory with no more than min v.c.   Time 12   Period Weeks   Status On-going               Plan - 02/06/16 1036    Clinical Impression Statement Pt is progressing slowly due to inconsistent ability to attend therapy and severity of memory deficits.   Rehab Potential Good   OT Frequency 2x / week   OT Duration 12 weeks   OT Treatment/Interventions Self-care/ADL  training;Moist Heat;Fluidtherapy;DME and/or AE instruction;Patient/family education;Therapeutic exercises;Therapeutic exercise;Therapeutic activities;Cognitive remediation/compensation;Neuromuscular education;Parrafin;Energy conservation;Manual Therapy;Visual/perceptual remediation/compensation   Plan practice using memory notebook, start use of individualized schedule   Consulted and Agree with Plan of Care Patient   Family Member Consulted wife      Patient will benefit from skilled therapeutic intervention in order to improve the following deficits and impairments:  Decreased coordination, Decreased safety awareness, Decreased endurance, Decreased activity tolerance, Impaired UE functional use, Decreased knowledge of use of DME, Decreased balance, Decreased cognition  Visit Diagnosis: Frontal lobe and executive function deficit  Other lack of coordination  Attention and concentration deficit    Problem List Patient Active Problem List   Diagnosis Date Noted  . Hepatitis C 01/31/2016  . Abnormal LFTs   . Polysubstance abuse 01/02/2016  . Cocaine abuse 01/02/2016  . Heroin abuse 01/02/2016  . Tobacco dependence 01/02/2016  . Elevated liver enzymes 01/02/2016  . Transient amnesia 01/02/2016  . Question of Seizure 01/02/2016  . Leukocytosis 01/02/2016    RINE,KATHRYN 02/06/2016, 10:40 AM  [facility] [address] [place], [place], [address] Phone: [phone]   Fax:  [phone]  Name: Marco Lynch MRN: [id number] Date of Birth: 06/18/1980

## 2016-02-06 NOTE — Therapy (Signed)
[facility] [address] [place], [place], [address] Phone: [phone]   Fax:  [phone]  Speech Language Pathology Treatment  Patient Details  Name: Marco MedinaJason Lynch MRN: [id number] Date of Birth: 11/22/1980 Referring Provider: Thad Rangerai, Ripudeep, M.D.  Encounter Date: 02/06/2016      End of Session - 02/06/16 1030    Visit Number 2   Number of Visits 24   Date for SLP Re-Evaluation 04/03/16   SLP Start Time 0915   SLP Stop Time  1000   SLP Time Calculation (min) 45 min   Activity Tolerance Patient tolerated treatment well      Past Medical History:  Diagnosis Date  . Current every day smoker   . Polysubstance abuse     No past surgical history on file.  There were no vitals filed for this visit.      Subjective Assessment - 02/06/16 1017    Subjective This is a lot of information               ADULT SLP TREATMENT - 02/06/16 0001      General Information   Behavior/Cognition Alert;Cooperative;Pleasant mood     Treatment Provided   Treatment provided Cognitive-Linquistic     Pain Assessment   Pain Assessment No/denies pain     Cognitive-Linquistic Treatment   Treatment focused on Cognition   Skilled Treatment Pt was seen for skilled ST following initial evaluation 01/31/16.  SLP and patient discussed current typical day with responsibilities at home, including caring for daughter, household chores, etc. Pt identified several areas which could be implemented into therapy, including establishing routines/habits, acquiring a more functional organizational system, use of med box to increase independence with med administration, planning.     Assessment / Recommendations / Plan   Plan Continue with current plan of care     Progression Toward Goals   Progression toward goals Progressing toward goals          SLP Education - 02/06/16 1029    Education provided Yes   Education Details strategies  for making use of memory book more functional and effective, establishment of routines, organization of daily activities   Person(s) Educated Patient   Methods Explanation;Demonstration;Handout   Comprehension Verbalized understanding;Need further instruction          SLP Short Term Goals - 02/06/16 1038      SLP SHORT TERM GOAL #1   Title pt will reach to look for information inside memory notebook 100% success in 3 sessions   Time 3   Period Weeks   Status On-going     SLP SHORT TERM GOAL #2   Title in response to SLP question, pt will locate information asked of him in notebook within 10 seconds, 90% of the time over three sessions   Time 3   Period Weeks   Status On-going     SLP SHORT TERM GOAL #3   Title in response to SLP verbal question, pt will tell SLP 4 addtional details of previous event/s since last therapy, using notebook to stimulate his memory   Time 3   Period Weeks   Status On-going     SLP SHORT TERM GOAL #4   Title pt and/or family will tell SLP pt is using memory notebook at home at least three times daily   Time 3   Period Weeks   Status On-going          SLP Long Term Goals - 02/06/16  1039      SLP LONG TERM GOAL #1   Title pt will demo independence with use of memory book in all therapy situations   Time 11   Period Weeks   Status On-going     SLP LONG TERM GOAL #2   Title pt will tell SLP two ways his memory notebook can help him in daily tasks   Time 11   Period Weeks   Status On-going          Plan - 02/06/16 1030    Clinical Impression Statement Pt was pleasant and cooperative with unfamiliar therapist. Pt freely discussed difficulties he was having at home (misplacing items, forgetting he had just asked about something, difficulty remembering to take meds). Pt discussed wife's variable work schedule, and was encouraged to ask her for a copy of her schedule. He mentioned a plan to retrieve weights and musical equipment from a  storage unit in [place] by the end of the year. Pt indicated he used to do the cooking for the family, and would like to get back into doing that.  Pt was given written information regarding problem solving topics discussed this session.    Speech Therapy Frequency 2x / week   Duration --  11 weeks   Treatment/Interventions Patient/family education;SLP instruction and feedback;Environmental controls;Cognitive reorganization;Internal/external aids;Cueing hierarchy;Functional tasks;Compensatory strategies   Potential to Achieve Goals Good   Potential Considerations Financial resources;Severity of impairments   Consulted and Agree with Plan of Care Patient      Patient will benefit from skilled therapeutic intervention in order to improve the following deficits and impairments:   Cognitive communication deficit  Frontal lobe and executive function deficit  Attention and concentration deficit    Problem List Patient Active Problem List   Diagnosis Date Noted  . Hepatitis C 01/31/2016  . Abnormal LFTs   . Polysubstance abuse 01/02/2016  . Cocaine abuse 01/02/2016  . Heroin abuse 01/02/2016  . Tobacco dependence 01/02/2016  . Elevated liver enzymes 01/02/2016  . Transient amnesia 01/02/2016  . Question of Seizure 01/02/2016  . Leukocytosis 01/02/2016   Moiz Ryant B. HeyburnBueche, MSP, CCC-SLP  Leigh AuroraBueche, Geovanna Simko Brown 02/06/2016, 10:41 AM  [facility] [address] [place], [place], [address] Phone: [phone]   Fax:  [phone]   Name: Marco MedinaJason Lynch MRN: [id number] Date of Birth: 08/04/1980

## 2016-02-10 ENCOUNTER — Encounter: Payer: Self-pay | Admitting: Occupational Therapy

## 2016-02-10 ENCOUNTER — Ambulatory Visit: Payer: Self-pay | Admitting: *Deleted

## 2016-02-13 ENCOUNTER — Ambulatory Visit: Payer: Self-pay | Admitting: *Deleted

## 2016-02-13 ENCOUNTER — Ambulatory Visit: Payer: Self-pay | Admitting: Occupational Therapy

## 2016-02-17 ENCOUNTER — Ambulatory Visit: Payer: Self-pay | Admitting: *Deleted

## 2016-02-17 ENCOUNTER — Ambulatory Visit: Payer: Self-pay | Admitting: Occupational Therapy

## 2016-02-20 ENCOUNTER — Ambulatory Visit: Payer: Self-pay | Admitting: *Deleted

## 2016-02-20 ENCOUNTER — Ambulatory Visit: Payer: Self-pay | Admitting: Occupational Therapy

## 2016-02-24 ENCOUNTER — Encounter: Payer: Self-pay | Admitting: *Deleted

## 2016-02-24 ENCOUNTER — Encounter: Payer: Self-pay | Admitting: Occupational Therapy

## 2016-02-25 ENCOUNTER — Encounter: Payer: Self-pay | Admitting: *Deleted

## 2016-03-02 ENCOUNTER — Encounter: Payer: Self-pay | Admitting: Neurology

## 2016-03-02 ENCOUNTER — Encounter: Payer: Self-pay | Admitting: Occupational Therapy

## 2016-03-02 ENCOUNTER — Encounter: Payer: Self-pay | Admitting: *Deleted

## 2016-03-02 ENCOUNTER — Ambulatory Visit (INDEPENDENT_AMBULATORY_CARE_PROVIDER_SITE_OTHER): Payer: Self-pay | Admitting: Neurology

## 2016-03-02 VITALS — BP 126/80 | HR 97 | Ht 70.0 in | Wt 178.2 lb

## 2016-03-02 DIAGNOSIS — G40209 Localization-related (focal) (partial) symptomatic epilepsy and epileptic syndromes with complex partial seizures, not intractable, without status epilepticus: Secondary | ICD-10-CM

## 2016-03-02 MED ORDER — OXCARBAZEPINE 300 MG PO TABS: ORAL_TABLET | ORAL | 4 refills | Status: DC

## 2016-03-02 NOTE — Progress Notes (Signed)
NEUROLOGY CONSULTATION NOTE  Marco Lynch MRN: [id number] DOB: April 09, 1980  Referring provider: Dr. Thad Ranger  Primary care provider: Dr. Jaclyn Shaggy  Reason for consult:  New onset seizure  Dear Dr Isidoro Donning:  Thank you for your kind referral of Marco Lynch for consultation of the above symptoms. Although his history is well known to you, please allow me to reiterate it for the purpose of our medical record. The patient was accompanied to the clinic by his wife who also provides collateral information. Records and images were personally reviewed where available.  HISTORY OF PRESENT ILLNESS: This is a pleasant 35 year old right-handed man with a history of polysubstance abuse (cocaine and heroin, last intake 2014), in his usual state of health until 01/01/16 when his wife noticed that he was in the shower for a long time. He got into the shower at 9:50pm, then when she finally realized an hour later that he was not yet done, they broke into the shower and found him on the ground unresponsive with sonorous respirations. He woke up a few minutes later and did not have any focal weakness, his wife felt the right side of his face may have been droopy. He was not acting right the next morning and his wife brought him to the ER where he was slightly confused, amnestic of the episode the night prior. In the ER, he could not recall the day of the week. He was noted to have amnesia in the ER, he could not recall events that occurred within the past few minutes. He had an MRI brain without contrast which I personally reviewed, there was mild swelling of bilateral hippocampi with T2/FLAIR hyperintensity with DWI changes (likely a combination of shine through and true restriction based on ADC map), consistent with seizure phenomenon. Encephalitis was felt unlikely given the asymmetric limited appearance and clinical status. He had a lumbar puncture with CSF WBC 1, protein 70, glucose 23. Negative HSV, arbovirus  panel, Lyme. Bloodwork showed nonreactive RPR, HIV. His LFTs were elevated, AST 112, ALT 298 then repeat AST 98, ALT 282, ammonia 57 then repeat was 31, hepatitis panel was negative for Hep A and B, >11.0 for HCV Ab (Hepatitis C genotype 1a). 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 IgG IFA 1:64 (positive, suggestive of past or possible current infection). His routine wake and sleep EEG was normal. He was discharged home on Keppra 500mg  BID.  He and his wife deny any further episodes of loss of consciousness since September. His wife feels he may have had an unwitnessed seizure last weekend due to significant memory loss, he asked ten times where they were going for Thanksgiving. Since the initial event, he has had significant cognitive problems and has been going to occupational therapy. This has been helpful "somewhat," he feels like his memory is getting better, but he can only retain information for short periods of time. He did not know what today was. His wife feels that his memory had been improving, until last weekend when he had significant worsening, making her think he had another seizure. She reports that he is poorly compliant with the Keppra, she makes sure he takes the morning dose, but cannot ensure the evening dose. He states he would have a headache or nausea if he misses a dose, feeling better after he takes Keppra. They however have noticed a significant change in personality since starting the Keppra. She states he is the most patient man, however since  the seizure, he has lost a lot of his patience, he is quick to be frustrated, throwing his phone, walking away from his daughter. He is so negative, thinking people are dead. His wife states there are times when he "won't listen," she would call him several times and he would eventually look at her. He has noticed some gaps in time, he feels lost, thinking it has only been 30 minutes that passed when 4 hours have already  gone by. He denies any olfactory/gustatory hallucinations, deja vu, rising epigastric sensation, focal numbness/tingling/weakness, myoclonic jerks. 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   He has noticed a lot of eye blinking since the seizure, he is noted to have left lower lid twitching in the office today. He denies any dizziness, vision changes, focal numbness/tingling/weakness, bowel/bladder dysfunction. He was having insomnia prior to the event, sleeping 3-4 hours, but has noticed sleep is better since starting Keppra. They are very surprised with the results of hepatitis testing, and state that they don't know if he does or does not have hepatitis C, stating that one person told them no, then another said he has it. He reports history of IV drug use with cocaine and heroin in the past, and states he has not used them since 2014. He drinks alcohol socially. 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   Epilepsy Risk Factors:  He had a normal birth and early development.  There is no history of febrile convulsions, CNS infections such as meningitis/encephalitis, significant traumatic brain injury, neurosurgical procedures, or family history of seizures.  PAST MEDICAL HISTORY: Past Medical History:  Diagnosis Date  . Current every day smoker   . Polysubstance abuse     PAST SURGICAL HISTORY: No past surgical history on file.  MEDICATIONS: Current Outpatient Prescriptions on File Prior to Visit  Medication Sig Dispense Refill  . ibuprofen (ADVIL,MOTRIN) 400 MG tablet Take 1 tablet (400 mg total) by mouth every 6 (six) hours as needed for fever, headache, mild pain or moderate pain. 30 tablet 0  . levETIRAcetam (KEPPRA) 500 MG tablet Take 1 tablet (500 mg total) by mouth 2 (two) times daily. 60 tablet 3  . thiamine (VITAMIN B-1) 100 MG tablet Take 1 tablet (100 mg total) by mouth  daily. 30 tablet 3  . UNABLE TO FIND OUTPATIENT PHYSICAL THERAPY   Diagnosis: Seizure 1 Mutually Defined 0  . 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 doxycycline (VIBRA-TABS) 100 MG tablet Take 1 tablet (100 mg total) by mouth 2 (two) times daily. (Patient not taking: Reported on 03/02/2016) 20 tablet 0   No current facility-administered medications on file prior to visit.     ALLERGIES: No Known Allergies  FAMILY HISTORY: No family history on file.  SOCIAL HISTORY: Social History   Social History  . Marital status: Married    Spouse name: N/A  . Number of children: N/A  . Years of education: N/A   Occupational History  . Not on file.   Social History Main Topics  . Smoking status: Current Every Day Smoker    Packs/day: 1.00    Types: Cigarettes  . Smokeless tobacco: Never Used  . Alcohol use 1.2 - 1.8 oz/week    2 - 3 Cans of  beer per week     Comment: "not a big drinker" according to patient  . Drug use: No  . Sexual activity: Not on file   Other Topics Concern  . Not on file   Social History Narrative  . No narrative on file    REVIEW OF SYSTEMS: Constitutional: No fevers, chills, or sweats, no generalized fatigue, change in appetite Eyes: No visual changes, double vision, eye pain Ear, nose and throat: No hearing loss, ear pain, nasal congestion, sore throat Cardiovascular: No chest pain, palpitations Respiratory:  No shortness of breath at rest or with exertion, wheezes GastrointestinaI: No nausea, vomiting, diarrhea, abdominal pain, fecal incontinence Genitourinary:  No dysuria, urinary retention or frequency Musculoskeletal:  No neck pain, back pain Integumentary: No rash, pruritus, skin lesions Neurological: as above Psychiatric: No depression, insomnia, anxiety Endocrine: No palpitations, fatigue, diaphoresis, mood swings, change in  appetite, change in weight, increased thirst Hematologic/Lymphatic:  No anemia, purpura, petechiae. Allergic/Immunologic: no itchy/runny eyes, nasal congestion, recent allergic reactions, rashes  PHYSICAL EXAM: Vitals:   03/02/16 0909  BP: 126/80  Pulse: 97   General: No acute distress Head:  Normocephalic/atraumatic, he has twitching of the left lower lid that stops when eyes are closed Eyes: Fundoscopic exam shows bilateral sharp discs, no vessel changes, exudates, or hemorrhages Neck: supple, no paraspinal tenderness, full range of motion Back: No paraspinal tenderness Heart: regular rate and rhythm Lungs: Clear to auscultation bilaterally. Vascular: No carotid bruits. Skin/Extremities: No rash, no edema Neurological Exam: Mental status: alert and oriented to person, place, and time, no dysarthria or aphasia, Fund of knowledge is appropriate.  Remote memory intact. 1/3 delayed recall.  Attention and concentration are normal.    Able to name objects and repeat phrases. Cranial nerves: CN I: not tested CN II: pupils equal, round and reactive to light, visual fields intact, fundi unremarkable. CN III, IV, VI:  full range of motion, no nystagmus, no ptosis CN V: facial sensation intact CN VII: upper and lower face symmetric CN VIII: hearing intact to finger rub CN IX, X: gag intact, uvula midline CN XI: sternocleidomastoid and trapezius muscles intact CN XII: tongue midline Bulk & Tone: normal, no fasciculations. Motor: 5/5 throughout with no pronator drift. Sensation: intact to light touch, cold, pin, vibration and joint position sense.  No extinction to double simultaneous stimulation.  Romberg test negative Deep Tendon Reflexes: +2 throughout, no ankle clonus Plantar responses: downgoing bilaterally Cerebellar: no incoordination on finger to nose, heel to shin. No dysdiadochokinesia Gait: narrow-based and steady, able to tandem walk adequately. Tremor:  none  IMPRESSION: This is a pleasant 35 year old right-handed man with a history of polysubstance abuse (none since 2014 per patient), who was found unresponsive with sonorous respiration last 01/01/16. He has had cognitive issues since then, at times worse than others. His MRI brain had shown changes in the bilateral hippocampi concerning for post-ictal change. Etiology of new onset seizures unclear, LP overall unremarkable. EEG normal. He continues to notice gaps in time, and his wife reports some inattention. Concern for subclinical seizures causing continued cognitive changes. There are some compliance issues with Keppra, as well as significant mood changes. He will start oxcarbazepine 150mg  BID with uptitraion over the next few weeks to 600mg  BID. Side effects were discussed. He will continue Keppra for now, with plans to taper off on his next visit. A repeat MRI brain with and without contrast will be ordered. If cognitive symptoms continue on adequate doses of oxcarbazepine, we  will plan for prolonged EEG to assess for subclinical seizures. We discussed findings of hepatitis and RMSF, which they are unsure if true or not, he will follow-up with PCP on this, potentially needing an ID consult. He continues to be unable to return to work at this time and will be re-evaluated on his next visit. [place] driving laws were discussed with the patient, and he knows to stop driving after a seizure, until 6 months seizure-free. He will follow-up in 1 month and knows to call for any changes.   Thank you for allowing me to participate in the care of this patient. Please do not hesitate to call for any questions or concerns.   Patrcia DollyKaren Laurelai Lepp, M.D.  CC: Dr. Venetia NightAmao

## 2016-03-02 NOTE — Patient Instructions (Signed)
1. Schedule MRI brain with and without contrast seizure protocol once we have checked on [facility] discount status 2. Continue Keppra 500mg  twice a day 3. Start Trileptal 300mg : Take 1/2 tablet twice a day for 1 week, then increase to 1 tablet twice a day for 1 week, then increase to 2 tablets twice a day and continue 4. Follow-up in 1 month, call for any changes  Seizure Precautions: 1. If medication has been prescribed for you to prevent seizures, take it exactly as directed.  Do not stop taking the medicine without talking to your doctor first, even if you have not had a seizure in a long time.   2. Avoid activities in which a seizure would cause danger to yourself or to others.  Don't operate dangerous machinery, swim alone, or climb in high or dangerous places, such as on ladders, roofs, or girders.  Do not drive unless your doctor says you may.  3. If you have any warning that you may have a seizure, lay down in a safe place where you can't hurt yourself.    4.  No driving for 6 months from last seizure, as per [facility] [place].   Please refer to the following link on the Epilepsy Foundation of America's website for more information: http://www.epilepsyfoundation.org/answerplace/Social/driving/drivingu.cfm   5.  Maintain good sleep hygiene. Avoid alcohol.  6.  Contact your doctor if you have any problems that may be related to the medicine you are taking.  7.  Call 911 and bring the patient back to the ED if:        A.  The seizure lasts longer than 5 minutes.       B.  The patient doesn't awaken shortly after the seizure  C.  The patient has new problems such as difficulty seeing, speaking or moving  D.  The patient was injured during the seizure  E.  The patient has a temperature over 102 F (39C)  F.  The patient vomited and now is having trouble breathing

## 2016-03-05 ENCOUNTER — Encounter: Payer: Self-pay | Admitting: *Deleted

## 2016-03-05 ENCOUNTER — Encounter: Payer: Self-pay | Admitting: Occupational Therapy

## 2016-03-06 MED FILL — OXcarbazepine 300 MG TABS: 300 | 30 days supply | Qty: 45 | Fill #0

## 2016-03-10 ENCOUNTER — Encounter: Payer: Self-pay | Admitting: Occupational Therapy

## 2016-03-10 ENCOUNTER — Telehealth: Payer: Self-pay

## 2016-03-10 ENCOUNTER — Encounter: Payer: Self-pay | Admitting: *Deleted

## 2016-03-10 NOTE — Telephone Encounter (Signed)
Left message for patient to return call. Contacted [facility] Assistance program and they stated he is not on their list for assistance. If he gets approved he will receive a letter in the mail with his account information.

## 2016-03-12 ENCOUNTER — Encounter: Payer: Self-pay | Admitting: *Deleted

## 2016-03-12 ENCOUNTER — Encounter: Payer: Self-pay | Admitting: Occupational Therapy

## 2016-03-23 ENCOUNTER — Encounter: Payer: Self-pay | Admitting: Speech Pathology

## 2016-03-26 NOTE — Progress Notes (Signed)
[facility] paperwork faxed, copy sent to patient per request and copy kept at [employer] desk.

## 2016-04-02 ENCOUNTER — Other Ambulatory Visit: Payer: Self-pay | Admitting: Neurology

## 2016-04-02 ENCOUNTER — Ambulatory Visit (INDEPENDENT_AMBULATORY_CARE_PROVIDER_SITE_OTHER): Payer: Self-pay | Admitting: Neurology

## 2016-04-02 ENCOUNTER — Encounter: Payer: Self-pay | Admitting: Neurology

## 2016-04-02 VITALS — BP 100/60 | HR 72 | Ht 70.0 in | Wt 181.1 lb

## 2016-04-02 DIAGNOSIS — G40209 Localization-related (focal) (partial) symptomatic epilepsy and epileptic syndromes with complex partial seizures, not intractable, without status epilepticus: Secondary | ICD-10-CM

## 2016-04-02 DIAGNOSIS — R41841 Cognitive communication deficit: Secondary | ICD-10-CM

## 2016-04-02 MED ORDER — OXCARBAZEPINE 300 MG PO TABS: ORAL_TABLET | ORAL | 4 refills | Status: DC

## 2016-04-02 NOTE — Patient Instructions (Addendum)
1. Increase Oxcarbazepine 300mg : Take 2 tablets twice a day 2. Start reducing Keppra 500mg : Take 1 tablet daily for 1 week, then reduce to 1 tablet every other day for 1 week, then stop 3. Proceed with MRI brain with and without contrast 4. Refer to Speech therapy again for cognitive communication deficit 5. Follow-up in 2 months  Seizure Precautions: 1. If medication has been prescribed for you to prevent seizures, take it exactly as directed.  Do not stop taking the medicine without talking to your doctor first, even if you have not had a seizure in a long time.   2. Avoid activities in which a seizure would cause danger to yourself or to others.  Don't operate dangerous machinery, swim alone, or climb in high or dangerous places, such as on ladders, roofs, or girders.  Do not drive unless your doctor says you may.  3. If you have any warning that you may have a seizure, lay down in a safe place where you can't hurt yourself.    4.  No driving for 6 months from last seizure, as per [facility] [place] law.   Please refer to the following link on the Epilepsy Foundation of America's website for more information: http://www.epilepsyfoundation.org/answerplace/Social/driving/drivingu.cfm   5.  Maintain good sleep hygiene. Avoid alcohol.  6.  Contact your doctor if you have any problems that may be related to the medicine you are taking.  7.  Call 911 and bring the patient back to the ED if:        A.  The seizure lasts longer than 5 minutes.       B.  The patient doesn't awaken shortly after the seizure  C.  The patient has new problems such as difficulty seeing, speaking or moving  D.  The patient was injured during the seizure  E.  The patient has a temperature over 102 F (39C)  F.  The patient vomited and now is having trouble breathing

## 2016-04-02 NOTE — Progress Notes (Signed)
NEUROLOGY FOLLOW UP OFFICE NOTE  Marco Lynch [id number]  HISTORY OF PRESENT ILLNESS: I had the pleasure of seeing Marco Lynch in follow-up in the neurology clinic on 04/02/2016.  The patient was last seen a month ago after an unwitnessed presumed seizure last 01/01/16. He is accompanied by his mother-in-law who helps supplement the history today. He had an MRI brain in the ER which showed mild swelling of bilateral hippocampi, CSF unremarkable. 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 Routine EEG normal. He was discharged on Keppra 500mg  BID. He continued to have cognitive and personality changes, and possibly a seizure last month when he had significant memory loss. Concern for subclinical seizures causing continued cognitive changes, as well as compliance issues and personality changes, Oxcarbazepine was added to his regimen. His mother-in-law has not seen any seizures. His wife has told her that every once in a while he goes to sleep and stays asleep for a long period of time. He is still irritable. Sometimes he is up all night. He is also having headaches every other day. No further episodes of loss of consciousness.  HPI 03/02/2016: This is a pleasant 35 yo RH man with a history of polysubstance abuse (cocaine and heroin, last intake 2014), in his usual state of health until 01/01/16 when his wife noticed that he was in the shower for a long time. He got into the shower at 9:50pm, then when she finally realized an hour later that he was not yet done, they broke into the shower and found him on the ground unresponsive with sonorous respirations. He woke up a few minutes later and did not have any focal weakness, his wife felt the right side of his face may have been droopy. He was not acting right the next morning and his wife brought him to the ER where he was slightly confused, amnestic of the episode the night prior. In the ER, he could not recall the day of the week. He was noted to have  amnesia in the ER, he could not recall events that occurred within the past few minutes. He had an MRI brain without contrast which I personally reviewed, there was mild swelling of bilateral hippocampi with T2/FLAIR hyperintensity with DWI changes (likely a combination of shine through and true restriction based on ADC map), consistent with seizure phenomenon. Encephalitis was felt unlikely given the asymmetric limited appearance and clinical status. He had a lumbar puncture with CSF WBC 1, protein 70, glucose 23. Negative HSV, arbovirus panel, Lyme. Bloodwork showed nonreactive RPR, HIV. His LFTs were elevated, AST 112, ALT 298 then repeat AST 98, ALT 282, ammonia 57 then repeat was 31, hepatitis panel was negative for Hep A and B, >11.0 for HCV Ab (Hepatitis C genotype 1a). 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 IgG IFA 1:64 (positive, suggestive of past or possible current infection). His routine wake and sleep EEG was normal. He was discharged home on Keppra 500mg  BID.  He and his wife deny any further episodes of loss of consciousness since September. His wife feels he may have had an unwitnessed seizure last November due to significant memory loss, he asked ten times where they were going for Thanksgiving. Since the initial event, he has had significant cognitive problems and has been going to occupational therapy. This has been helpful "somewhat," he feels like his memory is getting better, but he can only retain information for short periods of time. He did not  know what today was. His wife feels that his memory had been improving, until last weekend when he had significant worsening, making her think he had another seizure. She reports that he is poorly compliant with the Keppra, she makes sure he takes the morning dose, but cannot ensure the evening dose. He states he would have a headache or nausea if he misses a dose, feeling better after he takes Keppra. They however have  noticed a significant change in personality since starting the Keppra. She states he is the most patient man, however since the seizure, he has lost a lot of his patience, he is quick to be frustrated, throwing his phone, walking away from his daughter. He is so negative, thinking people are dead. His wife states there are times when he "won't listen," she would call him several times and he would eventually look at her. He has noticed some gaps in time, he feels lost, thinking it has only been 30 minutes that passed when 4 hours have already gone by. He denies any olfactory/gustatory hallucinations, deja vu, rising epigastric sensation, focal numbness/tingling/weakness, myoclonic jerks. 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   He has noticed a lot of eye blinking since the seizure, he is noted to have left lower lid twitching in the office today. He denies any dizziness, vision changes, focal numbness/tingling/weakness, bowel/bladder dysfunction. He was having insomnia prior to the event, sleeping 3-4 hours, but has noticed sleep is better since starting Keppra. They are very surprised with the results of hepatitis testing, and state that they don't know if he does or does not have hepatitis C, stating that one person told them no, then another said he has it. He reports history of IV drug use with cocaine and heroin in the past, and states he has not used them since 2014. He drinks alcohol socially. 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   Epilepsy Risk Factors:  He had a normal birth and early development.  There is no history of febrile convulsions, CNS infections such as meningitis/encephalitis, significant traumatic brain injury, neurosurgical procedures, or family history of seizures.  PAST MEDICAL HISTORY: Past Medical History:  Diagnosis Date  . Current every day smoker   .  Polysubstance abuse     MEDICATIONS: Current Outpatient Prescriptions on File Prior to Visit  Medication Sig Dispense Refill  . doxycycline (VIBRA-TABS) 100 MG tablet Take 1 tablet (100 mg total) by mouth 2 (two) times daily. (Patient not taking: Reported on 03/02/2016) 20 tablet 0  . ibuprofen (ADVIL,MOTRIN) 400 MG tablet Take 1 tablet (400 mg total) by mouth every 6 (six) hours as needed for fever, headache, mild pain or moderate pain. 30 tablet 0  . levETIRAcetam (KEPPRA) 500 MG tablet Take 1 tablet (500 mg total) by mouth 2 (two) times daily. 60 tablet 3  . Oxcarbazepine (TRILEPTAL) 300 MG tablet Take 1/2 tablet twice a day for 1 week, then increase to 1 tablet twice a day for 1 week, then increase to 2 tablets twice a day and continue 120 tablet 4  . thiamine (VITAMIN B-1) 100 MG tablet Take 1 tablet (100 mg total) by mouth daily. 30 tablet 3  . UNABLE TO FIND OUTPATIENT PHYSICAL THERAPY   Diagnosis: Seizure 1 Mutually Defined 0  . 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     ALLERGIES: No Known Allergies  FAMILY HISTORY: No family history on file.  SOCIAL HISTORY: Social History   Social History  . Marital status: Married    Spouse name: N/A  . Number of children: N/A  . Years of education: N/A   Occupational History  . Not on file.   Social History Main Topics  . Smoking status: Current Every Day Smoker    Packs/day: 1.00    Types: Cigarettes  . Smokeless tobacco: Never Used  . Alcohol use 1.2 - 1.8 oz/week    2 - 3 Cans of beer per week     Comment: "not a big drinker" according to patient  . Drug use: No  . Sexual activity: Not on file   Other Topics Concern  . Not on file   Social History Narrative  . No narrative on file    REVIEW OF SYSTEMS: Constitutional: No fevers,  chills, or sweats, no generalized fatigue, change in appetite Eyes: No visual changes, double vision, eye pain Ear, nose and throat: No hearing loss, ear pain, nasal congestion, sore throat Cardiovascular: No chest pain, palpitations Respiratory:  No shortness of breath at rest or with exertion, wheezes GastrointestinaI: No nausea, vomiting, diarrhea, abdominal pain, fecal incontinence Genitourinary:  No dysuria, urinary retention or frequency Musculoskeletal:  No neck pain, back pain Integumentary: No rash, pruritus, skin lesions Neurological: as above Psychiatric: No depression, +insomnia, anxiety Endocrine: No palpitations, fatigue, diaphoresis, mood swings, change in appetite, change in weight, increased thirst Hematologic/Lymphatic:  No anemia, purpura, petechiae. Allergic/Immunologic: no itchy/runny eyes, nasal congestion, recent allergic reactions, rashes  PHYSICAL EXAM: Vitals:   04/02/16 1207  BP: 100/60  Pulse: 72   General: No acute distress Head:  Normocephalic/atraumatic Neck: supple, no paraspinal tenderness, full range of motion Heart:  Regular rate and rhythm Lungs:  Clear to auscultation bilaterally Back: No paraspinal tenderness Skin/Extremities: No rash, no edema Neurological Exam: alert and oriented to person, place, and time. No aphasia or dysarthria. Fund of knowledge is appropriate.  Recent and remote memory are intact.  Attention and concentration are normal.    Able to name objects and repeat phrases. Cranial nerves: Pupils equal, round, reactive to light.  Extraocular movements intact with no nystagmus. Visual fields full. Facial sensation intact. No facial asymmetry. Tongue, uvula, palate midline.  Motor: Bulk and tone normal, muscle strength 5/5 throughout with no pronator drift.  Sensation to light touch intact.  No extinction to double simultaneous stimulation.  Deep tendon reflexes 2+ throughout, toes downgoing.  Finger to nose testing intact.  Gait  narrow-based and steady, able to tandem walk adequately.  Romberg negative.  IMPRESSION: This is a pleasant 35 yo RH man with a history of polysubstance abuse (none since 2014 per patient), who was found unresponsive with sonorous respiration last 01/01/16. He has had cognitive issues since then, at times worse than others. His MRI brain had shown changes in the bilateral hippocampi concerning for post-ictal change. Etiology of new onset seizures unclear, LP overall unremarkable. EEG normal. They reported continued gaps in time and inattention, concern for subclinical seizures causing continued cognitive changes. There were some compliance issues with Keppra, as well as significant mood changes. He is now on oxcarbazepine and will increase to 600mg  BID. He will start tapering off Keppra over the nex 2 weeks, hopefully this will help with  mood and headaches. Proceed with repeat MRI brain with and without contrast. If cognitive symptoms continue on adequate doses of oxcarbazepine, we will plan for prolonged EEG to assess for subclinical seizures. He had been doing speech therapy for cognitive communication deficit, and will resume therapy. He is aware of  driving laws were discussed with the patient, and he knows to stop driving after a seizure, until 6 months seizure-free. He remains unable to work due to cognitive deficits. He will follow-up in 2 months and knows to call for any changes.   Thank you for allowing me to participate in his care.  Please do not hesitate to call for any questions or concerns.  The duration of this appointment visit was 15 minutes of face-to-face time with the patient.  Greater than 50% of this time was spent in counseling, explanation of diagnosis, planning of further management, and coordination of care.   Patrcia Dolly, M.D.   CC: Dr. Venetia Night

## 2016-04-08 ENCOUNTER — Ambulatory Visit (HOSPITAL_COMMUNITY): Payer: Self-pay

## 2016-04-09 ENCOUNTER — Telehealth: Payer: Self-pay | Admitting: Neurology

## 2016-04-09 NOTE — Telephone Encounter (Signed)
PT called and said he needs a refill for Keppra called in/Dawn CB# [phone]

## 2016-04-10 NOTE — Telephone Encounter (Signed)
Keppra 500mg  called in to pharmacy. LVM for patient.

## 2016-04-10 NOTE — Telephone Encounter (Signed)
LVM to clarify he needs Keppra, per last OV he was to reduce it. Also verifying pharmacy.

## 2016-04-10 NOTE — Telephone Encounter (Signed)
Marco MedinaJason Lynch 02/27/1981. He uses [facility] in [place] now.  Marco Lynch [id number] came in to let us know he needs the refill on the medication Keppra to continue weaning off. The # to [facility] is [phone]. Thank you

## 2016-04-12 ENCOUNTER — Encounter: Payer: Self-pay | Admitting: Neurology

## 2016-04-13 ENCOUNTER — Ambulatory Visit (HOSPITAL_COMMUNITY): Admission: RE | Admit: 2016-04-13 | Payer: Self-pay | Source: Ambulatory Visit

## 2016-04-17 ENCOUNTER — Ambulatory Visit (HOSPITAL_COMMUNITY): Admission: RE | Admit: 2016-04-17 | Payer: Self-pay | Source: Ambulatory Visit | Admitting: Neurology

## 2016-04-20 ENCOUNTER — Ambulatory Visit (HOSPITAL_COMMUNITY)
Admission: RE | Admit: 2016-04-20 | Discharge: 2016-04-20 | Disposition: A | Discharge status: Home / Self Care | Payer: Self-pay | Source: Ambulatory Visit | Attending: Neurology | Admitting: Neurology

## 2016-04-20 DIAGNOSIS — G40209 Localization-related (focal) (partial) symptomatic epilepsy and epileptic syndromes with complex partial seizures, not intractable, without status epilepticus: Secondary | ICD-10-CM | POA: Insufficient documentation

## 2016-04-20 MED ORDER — GADOBENATE DIMEGLUMINE 529 MG/ML IV SOLN
20.0000 mL | Freq: Once | INTRAVENOUS | Status: AC
  Administered 2016-04-20: 18 mL via INTRAVENOUS

## 2016-04-27 ENCOUNTER — Telehealth: Payer: Self-pay

## 2016-04-27 NOTE — Telephone Encounter (Signed)
Patient notified, verbalized understanding. Results faxed to PCP.

## 2016-04-27 NOTE — Telephone Encounter (Signed)
-----   Message from Van ClinesKaren M Aquino, MD sent at 04/27/2016 10:15 AM EST ----- Pls let patient know the MRI brain is now normal, the changes seen on his previous scan have resolved. It does show some sinus issues, pls have him call his PCP if he is having sinus issues and send PCP the report. Thanks

## 2016-05-14 ENCOUNTER — Telehealth: Payer: Self-pay | Admitting: Neurology

## 2016-05-14 MED FILL — OXcarbazepine 300 MG TABS: 300 | 30 days supply | Qty: 120 | Fill #0

## 2016-05-14 NOTE — Telephone Encounter (Signed)
PT left a message that he needs a refill called in and said it was not the Keppra, he said the other one but didn't say what/Dawn CB#[phone]

## 2016-05-14 NOTE — Telephone Encounter (Signed)
Spoke with patients wife. Advised her to call pharmacy to see if medication was ready for pick up.  We sent 90 day supply with 4 refill 04-02-17.

## 2016-06-01 ENCOUNTER — Encounter: Payer: Self-pay | Admitting: Neurology

## 2016-06-01 ENCOUNTER — Ambulatory Visit (INDEPENDENT_AMBULATORY_CARE_PROVIDER_SITE_OTHER): Payer: Self-pay | Admitting: Neurology

## 2016-06-01 VITALS — BP 98/60 | HR 71 | Temp 97.8°F | Resp 16 | Ht 69.5 in | Wt 189.7 lb

## 2016-06-01 DIAGNOSIS — R51 Headache: Secondary | ICD-10-CM

## 2016-06-01 DIAGNOSIS — R519 Headache, unspecified: Secondary | ICD-10-CM | POA: Insufficient documentation

## 2016-06-01 DIAGNOSIS — G40209 Localization-related (focal) (partial) symptomatic epilepsy and epileptic syndromes with complex partial seizures, not intractable, without status epilepticus: Secondary | ICD-10-CM

## 2016-06-01 MED ORDER — OXCARBAZEPINE 300 MG PO TABS: ORAL_TABLET | ORAL | 11 refills | Status: AC

## 2016-06-01 MED ORDER — NORTRIPTYLINE HCL 25 MG PO CAPS: 25.0000 mg | ORAL_CAPSULE | Freq: Every day | ORAL | 11 refills | Status: AC

## 2016-06-01 NOTE — Patient Instructions (Addendum)
1. Continue Oxcarbazepine 300mg : Take 2 tablets twice a day 2. Start nortriptyline 25mg : Take 1 capsule at night 3. Minimize Ibuprofen intake to 2-3 times a week, otherwise headaches will not improve 4. [facility] [place] [place], [place] [address]     [phone] for Psychiatry visit  5. Follow-up in 3 months, call for any changes  Seizure Precautions: 1. If medication has been prescribed for you to prevent seizures, take it exactly as directed.  Do not stop taking the medicine without talking to your doctor first, even if you have not had a seizure in a long time.   2. Avoid activities in which a seizure would cause danger to yourself or to others.  Don't operate dangerous machinery, swim alone, or climb in high or dangerous places, such as on ladders, roofs, or girders.  Do not drive unless your doctor says you may.  3. If you have any warning that you may have a seizure, lay down in a safe place where you can't hurt yourself.    4.  No driving for 6 months from last seizure, [place].   Please refer to the following link on the [employer]ile[employer] of [employer]'s website for more information: http://www.epilepsyfoundation.org/answerplace/Social/driving/drivingu.cfm   5.  Maintain good sleep hygiene. Avoid alcohol.  6.  Contact your doctor if you have any problems that may be related to the medicine you are taking.  7.  Call 911 and bring the patient back to the ED if:        A.  The seizure lasts longer than 5 minutes.       B.  The patient doesn't awaken shortly after the seizure  C.  The patient has new problems such as difficulty seeing, speaking or moving  D.  The patient was injured during the seizure  E.  The patient has a temperature over 102 F (39C)  F.  The patient vomited and now is having trouble breathing

## 2016-06-01 NOTE — Progress Notes (Signed)
NEUROLOGY FOLLOW UP OFFICE NOTE  Marco Lynch [id number]  HISTORY OF PRESENT ILLNESS: I had the pleasure of seeing Marco Lynch in follow-up in the neurology clinic on 06/01/2016.  The patient was last seen 2 months ago after an unwitnessed presumed seizure last 01/01/16. He is accompanied by his mother-in-law who helps supplement the history today. He had an MRI brain in the ER which showed mild swelling of bilateral hippocampi, CSF unremarkable. 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 Routine EEG normal. He was discharged on Keppra 500mg  BID. He continued to have cognitive and personality changes, and possibly a seizure in November 2017 when he had significant memory loss. Oxcarbazepine was added to his regimen, increased to 600mg  BID on his last visit. He was also given tapering instructions for Keppra due to headaches and mood changes. Repeat brain MRI with and without contrast done 04/20/16 showed interval resolution of hippocampal signal abnormalities, hippocampi symmetric without abnormal signal or enhancement seen.   Since his last visit, they deny any further episodes of loss of consciousness since September 2017. He continues to have cognitive issues, his wife reports he makes up stuff and believes it is true. He states he did something that she knows he did not do, or he would say someone has died when they are still alive. He is off the Keppra and mood has improved some, but still not baseline. His wife states he has no patience anymore. She also reports anxiety, he would stay in the car and never goes in the store. He does not laugh as much, he used to be "goofy" but is not anymore. He continues to have daily constant headaches that wax and wane, worsening as the days goes on. No associated nausea/vomiting. He takes Ibuprofen daily. He has always had problems with insomnia. He drinks 2-3 12-oz beers daily.   HPI 03/02/2016: This is a pleasant 36 yo RH man with a history of polysubstance  abuse (cocaine and heroin, last intake 2014), in his usual state of health until 01/01/16 when his wife noticed that he was in the shower for a long time. He got into the shower at 9:50pm, then when she finally realized an hour later that he was not yet done, they broke into the shower and found him on the ground unresponsive with sonorous respirations. He woke up a few minutes later and did not have any focal weakness, his wife felt the right side of his face may have been droopy. He was not acting right the next morning and his wife brought him to the ER where he was slightly confused, amnestic of the episode the night prior. In the ER, he could not recall the day of the week. He was noted to have amnesia in the ER, he could not recall events that occurred within the past few minutes. He had an MRI brain without contrast which I personally reviewed, there was mild swelling of bilateral hippocampi with T2/FLAIR hyperintensity with DWI changes (likely a combination of shine through and true restriction based on ADC map), consistent with seizure phenomenon. Encephalitis was felt unlikely given the asymmetric limited appearance and clinical status. He had a lumbar puncture with CSF WBC 1, protein 70, glucose 23. Negative HSV, arbovirus panel, Lyme. Bloodwork showed nonreactive RPR, HIV. His LFTs were elevated, AST 112, ALT 298 then repeat AST 98, ALT 282, ammonia 57 then repeat was 31, hepatitis panel was negative for Hep A and B, >11.0 for HCV Ab (Hepatitis  C genotype 1a). 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 IgG IFA 1:64 (positive, suggestive of past or possible current infection). His routine wake and sleep EEG was normal. He was discharged home on Keppra 500mg  BID.  He and his wife deny any further episodes of loss of consciousness since September. His wife feels he may have had an unwitnessed seizure last November due to significant memory loss, he asked ten times where they were going  for Thanksgiving. Since the initial event, he has had significant cognitive problems and has been going to occupational therapy. This has been helpful "somewhat," he feels like his memory is getting better, but he can only retain information for short periods of time. He did not know what today was. His wife feels that his memory had been improving, until last weekend when he had significant worsening, making her think he had another seizure. She reports that he is poorly compliant with the Keppra, she makes sure he takes the morning dose, but cannot ensure the evening dose. He states he would have a headache or nausea if he misses a dose, feeling better after he takes Keppra. They however have noticed a significant change in personality since starting the Keppra. She states he is the most patient man, however since the seizure, he has lost a lot of his patience, he is quick to be frustrated, throwing his phone, walking away from his daughter. He is so negative, thinking people are dead. His wife states there are times when he "won't listen," she would call him several times and he would eventually look at her. He has noticed some gaps in time, he feels lost, thinking it has only been 30 minutes that passed when 4 hours have already gone by. He denies any olfactory/gustatory hallucinations, deja vu, rising epigastric sensation, focal numbness/tingling/weakness, myoclonic jerks. 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   He has noticed a lot of eye blinking since the seizure, he is noted to have left lower lid twitching in the office today. He denies any dizziness, vision changes, focal numbness/tingling/weakness, bowel/bladder dysfunction. He was having insomnia prior to the event, sleeping 3-4 hours, but has noticed sleep is better since starting Keppra. They are very surprised with the results of hepatitis testing, and state that they don't know if he does or does not have hepatitis C,  stating that one person told them no, then another said he has it. He reports history of IV drug use with cocaine and heroin in the past, and states he has not used them since 2014. He drinks alcohol socially. 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   Epilepsy Risk Factors:  He had a normal birth and early development.  There is no history of febrile convulsions, CNS infections such as meningitis/encephalitis, significant traumatic brain injury, neurosurgical procedures, or family history of seizures.  PAST MEDICAL HISTORY: Past Medical History:  Diagnosis Date  . Current every day smoker   . Polysubstance abuse     MEDICATIONS: Current Outpatient Prescriptions on File Prior to Visit  Medication Sig Dispense Refill  . doxycycline (VIBRA-TABS) 100 MG tablet Take 1 tablet (100 mg total) by mouth 2 (two) times daily. (Patient not taking: Reported on 04/02/2016) 20 tablet 0  . ibuprofen (ADVIL,MOTRIN) 400 MG tablet Take 1 tablet (400 mg total) by mouth every 6 (  six) hours as needed for fever, headache, mild pain or moderate pain. 30 tablet 0  . Oxcarbazepine (TRILEPTAL) 300 MG tablet Take 2 tablets twice a day and continue 120 tablet 4  . thiamine (VITAMIN B-1) 100 MG tablet Take 1 tablet (100 mg total) by mouth daily. 30 tablet 3  . UNABLE TO FIND OUTPATIENT PHYSICAL THERAPY   Diagnosis: Seizure 1 Mutually Defined 0  . 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     ALLERGIES: No Known Allergies  FAMILY HISTORY: No family history on file.  SOCIAL HISTORY: Social History   Social History  . Marital status: Married    Spouse name: N/A  . Number of children: N/A  . Years of education: N/A   Occupational History  .  Not on file.   Social History Main Topics  . Smoking status: Current Every Day Smoker    Packs/day: 1.00    Types: Cigarettes  . Smokeless tobacco: Never Used  . Alcohol use 1.2 - 1.8 oz/week    2 - 3 Cans of beer per week     Comment: "not a big drinker" according to patient  . Drug use: No  . Sexual activity: Not on file   Other Topics Concern  . Not on file   Social History Narrative  . No narrative on file    REVIEW OF SYSTEMS: Constitutional: No fevers, chills, or sweats, no generalized fatigue, change in appetite Eyes: No visual changes, double vision, eye pain Ear, nose and throat: No hearing loss, ear pain, nasal congestion, sore throat Cardiovascular: No chest pain, palpitations Respiratory:  No shortness of breath at rest or with exertion, wheezes GastrointestinaI: No nausea, vomiting, diarrhea, abdominal pain, fecal incontinence Genitourinary:  No dysuria, urinary retention or frequency Musculoskeletal:  No neck pain, back pain Integumentary: No rash, pruritus, skin lesions Neurological: as above Psychiatric: No depression, +insomnia, anxiety Endocrine: No palpitations, fatigue, diaphoresis, mood swings, change in appetite, change in weight, increased thirst Hematologic/Lymphatic:  No anemia, purpura, petechiae. Allergic/Immunologic: no itchy/runny eyes, nasal congestion, recent allergic reactions, rashes  PHYSICAL EXAM: Vitals:   06/01/16 0935  BP: 98/60  Pulse: 71  Resp: 16  Temp: 97.8 F (36.6 C)   General: No acute distress Head:  Normocephalic/atraumatic Neck: supple, no paraspinal tenderness, full range of motion Heart:  Regular rate and rhythm Lungs:  Clear to auscultation bilaterally Back: No paraspinal tenderness Skin/Extremities: No rash, no edema Neurological Exam: alert and oriented to person, place, and time. No aphasia or dysarthria. Fund of knowledge is appropriate.  Recent and remote memory are intact.  Attention and concentration are  normal.    Able to name objects and repeat phrases. Cranial nerves: Pupils equal, round, reactive to light.  Extraocular movements intact with no nystagmus. Visual fields full. Facial sensation intact. No facial asymmetry. Tongue, uvula, palate midline.  Motor: Bulk and tone normal, muscle strength 5/5 throughout with no pronator drift.  Sensation to light touch intact.  No extinction to double simultaneous stimulation.  Deep tendon reflexes 2+ throughout, toes downgoing.  Finger to nose testing intact.  Gait narrow-based and steady, able to tandem walk adequately.  Romberg negative.  IMPRESSION: This is a pleasant 36 yo RH man with a history of polysubstance abuse (none since 2014 per patient), who was found unresponsive with sonorous respiration last 01/01/16. He has  had cognitive issues since then, at times worse than others. His MRI brain had shown changes in the bilateral hippocampi concerning for post-ictal change. Etiology of new onset seizures unclear, LP overall unremarkable. EEG normal. They reported continued gaps in time and inattention and headaches. Repeat MRI brain showed resolution of signal changes in the hippocampi, normal brain MRI. He is now off Keppra but continues to have mood issues and headaches. No further seizures. Continue oxcarbazepine 600mg  BID. I discussed his symptoms and effects of depression/anxiety on memory and headaches. He is agreeable to start nortriptyline 25mg  qhs for headache prophylaxis and insomnia, this may help with mood as well. He was advised to see a psychiatrist at [facility]. There is also likely a component of medication overuse headaches, he was advised to minimize over the counter pain medications to 2-3 a week. He is aware of [place] driving laws to stop driving after a seizure, until 6 months seizure-free. He remains unable to work due to cognitive deficits and headaches. He will follow-up in 3 months and knows to call for any changes.   Thank you for allowing me to  participate in his care.  Please do not hesitate to call for any questions or concerns.  The duration of this appointment visit was 25 minutes of face-to-face time with the patient.  Greater than 50% of this time was spent in counseling, explanation of diagnosis, planning of further management, and coordination of care.   Patrcia Dolly, M.D.   CC: Dr. Venetia Night

## 2016-07-24 ENCOUNTER — Telehealth: Payer: Self-pay | Admitting: Neurology

## 2016-07-24 NOTE — Telephone Encounter (Signed)
Patient's mother in law is not on the [facility]. We can call and discuss with patient.

## 2016-07-24 NOTE — Telephone Encounter (Signed)
Caller: Patient's mother in law for him  Urgent? No  Reason for the call: He is getting his memory back and wants to get a sooner appointment. He also wants to go back to work. Please call. Thanks

## 2016-07-27 NOTE — Telephone Encounter (Signed)
Returned call.  Spoke with Pt's wife.  Wife unaware of call last week.  I asked that pt return my call.  Wife stated she will relay message to pt.

## 2016-09-07 ENCOUNTER — Ambulatory Visit: Payer: Self-pay | Admitting: Neurology

## 2016-09-07 DIAGNOSIS — Z029 Encounter for administrative examinations, unspecified: Secondary | ICD-10-CM

## 2016-09-08 ENCOUNTER — Encounter: Payer: Self-pay | Admitting: Neurology

## 2018-01-08 IMAGING — MR MR HEAD W/O CM
8 of 10 series · 36 of 48 positions shown · non-contrast
Comparison: Head CT from earlier today

CLINICAL DATA: Fall in shower. Altered mental status with transient
global amnesia. CVA versus seizure.

EXAM:
MRI HEAD WITHOUT CONTRAST
TECHNIQUE: Multiplanar, multiecho pulse sequences of the brain and surrounding
structures were obtained without intravenous contrast.

[Series 3: DWI · axial · 3.0mm · 1.09mm/px · z∈[-72,+77]mm · 8 of 104 slices shown (1 of 4)]
[im 1/104]
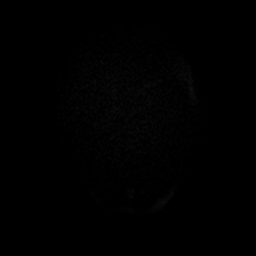
[im 12/104]
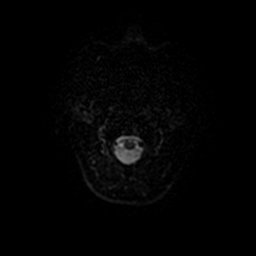
[im 35/104]
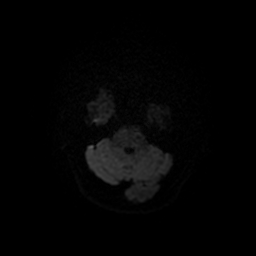
[im 46/104]
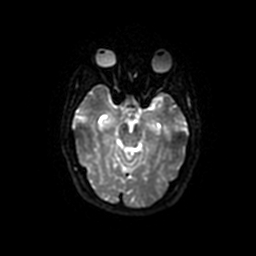
[im 58/104]
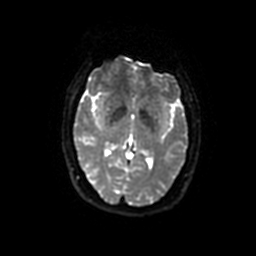
[im 69/104]
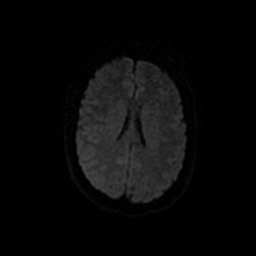
[im 92/104]
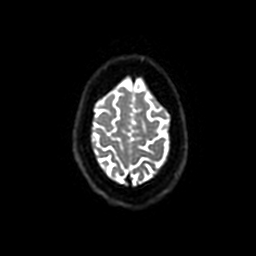
[im 104/104]
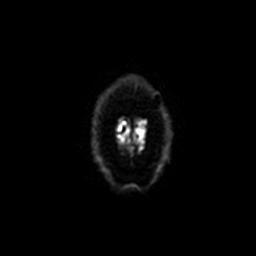

[Series 4: T1 · sagittal · 5.0mm · 0.47mm/px · 2 of 24 slices shown]
[im 1/24]
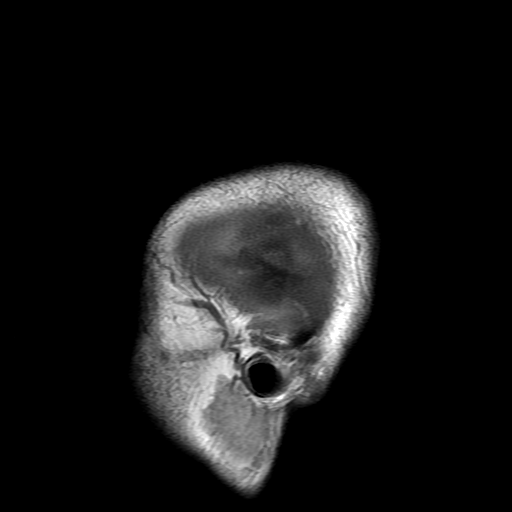
[im 24/24]
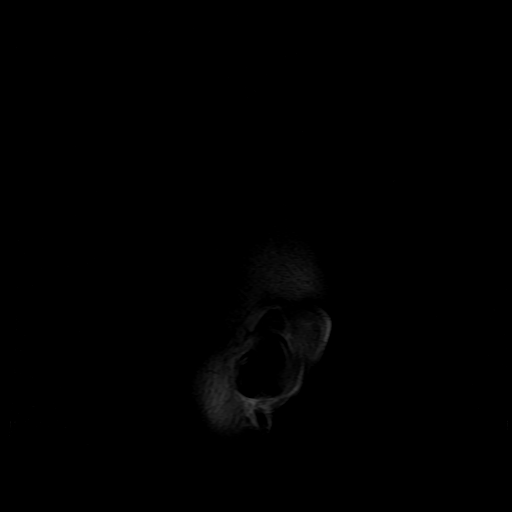

[Series 5: DWI · coronal · 5.0mm · 1.09mm/px · 7 of 68 slices shown (2 of 4)]
[im 1/68]
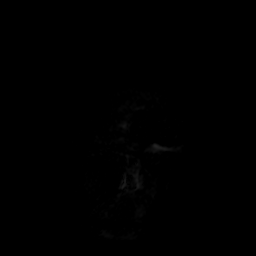
[im 12/68]
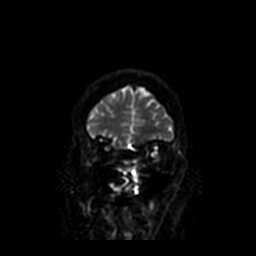
[im 23/68]
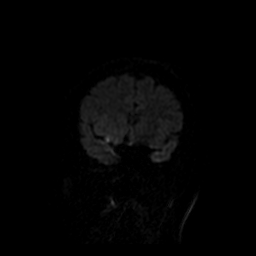
[im 34/68]
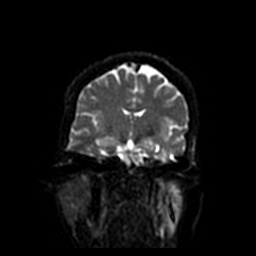
[im 45/68]
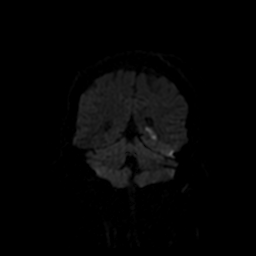
[im 56/68]
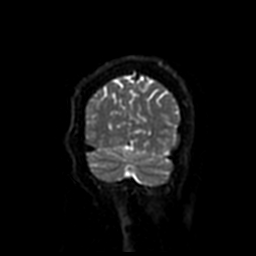
[im 68/68]
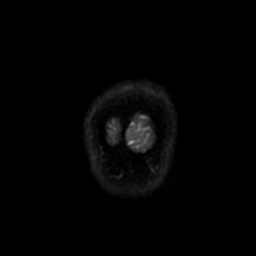

[Series 6: T2 · axial · 5.0mm · 0.43mm/px · z∈[-63,+81]mm · 3 of 24 slices shown (1 of 2)]
[im 1/24]
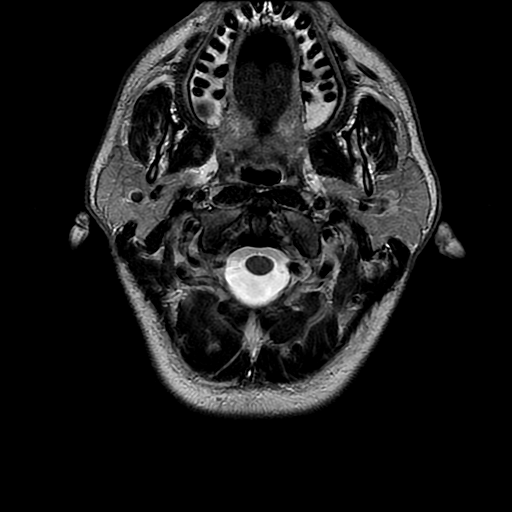
[im 12/24]
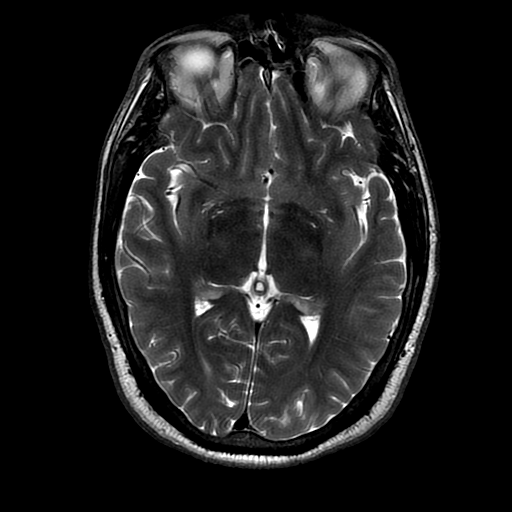
[im 24/24]
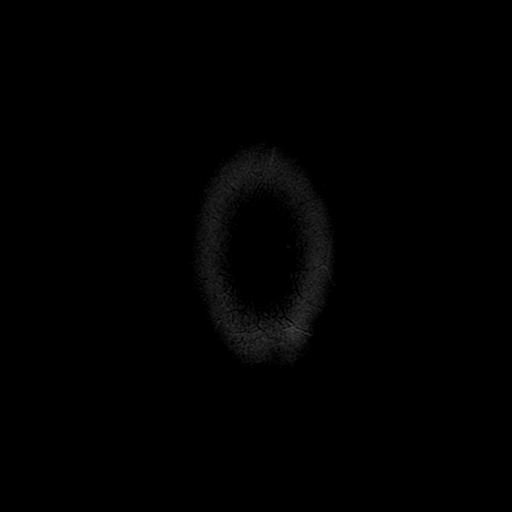

[Series 7: FLAIR · axial · 5.0mm · 0.43mm/px · z∈[-69,+86]mm · 3 of 24 slices shown]
[im 1/24]
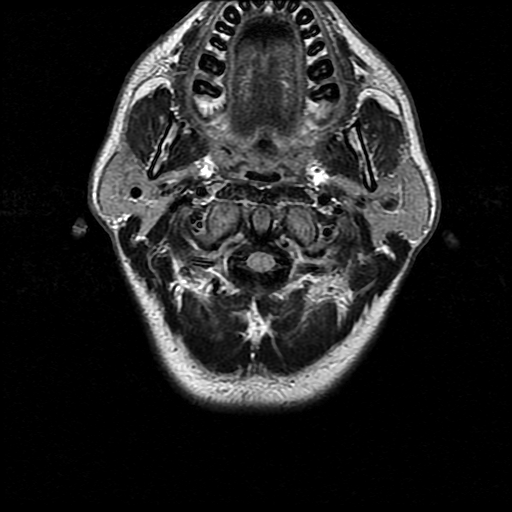
[im 12/24]
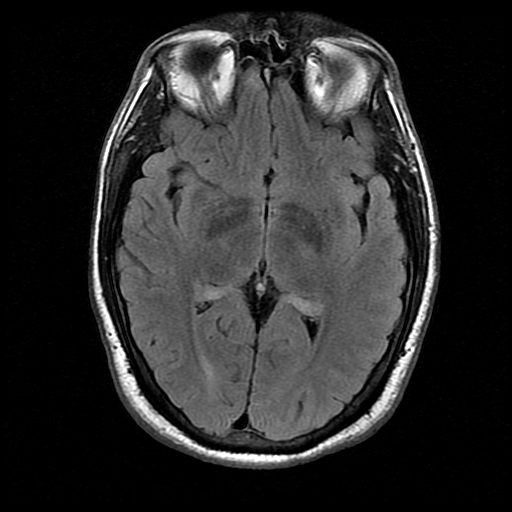
[im 24/24]
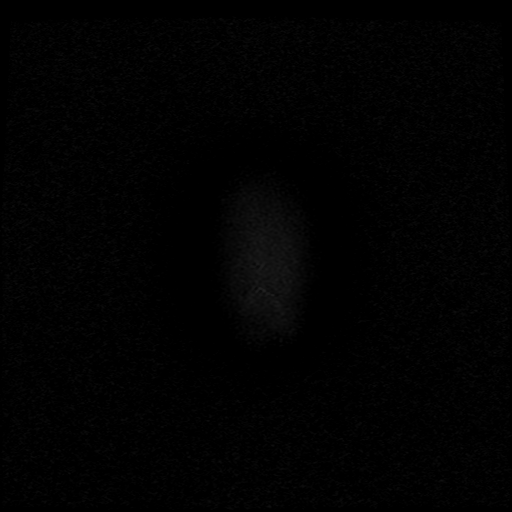

[Series 10: T2 · coronal · 5.0mm · 0.47mm/px · 3 of 24 slices shown (2 of 2)]
[im 1/24]
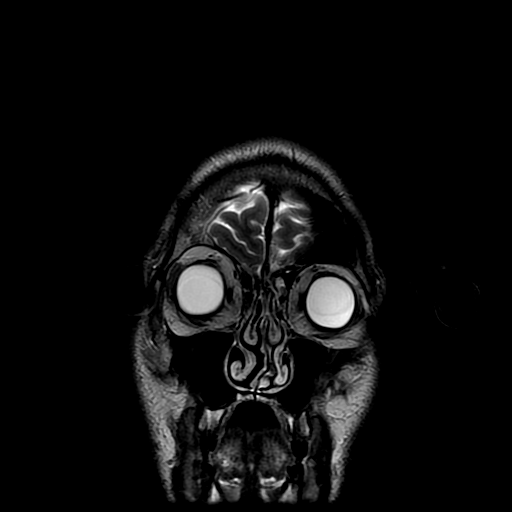
[im 12/24]
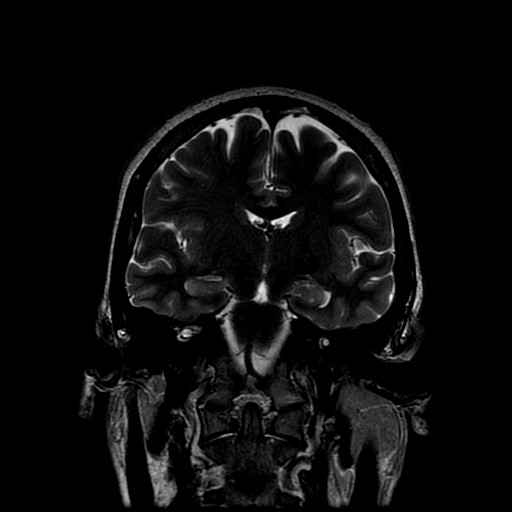
[im 24/24]
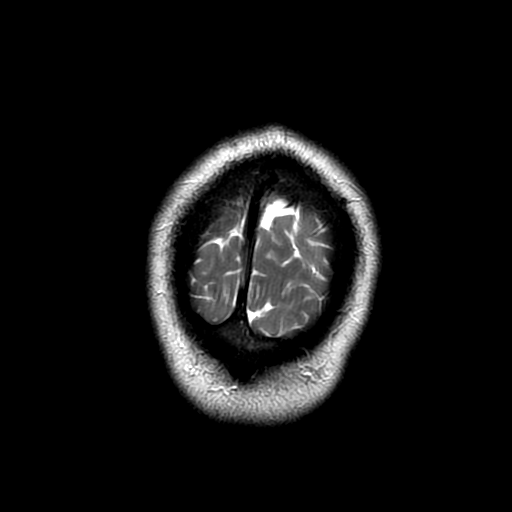

[Series 300: DWI · axial · 3.0mm · 1.09mm/px · z∈[-72,+77]mm · 6 of 52 slices shown (3 of 4)]
[im 1/52]
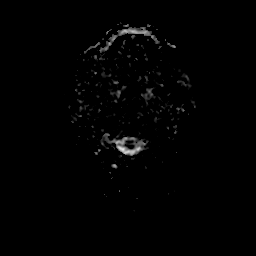
[im 11/52]
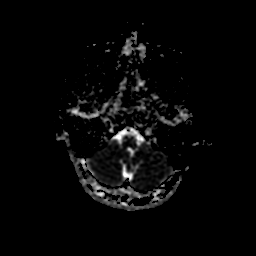
[im 21/52]
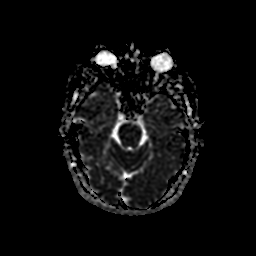
[im 31/52]
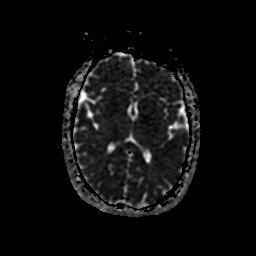
[im 41/52]
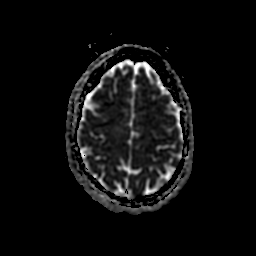
[im 52/52]
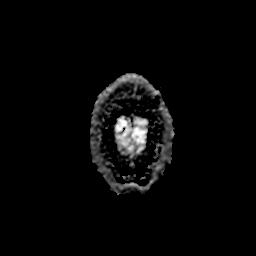

[Series 500: DWI · coronal · 5.0mm · 1.09mm/px · 4 of 34 slices shown (4 of 4)]
[im 1/34]
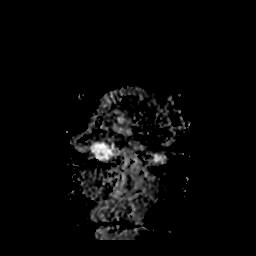
[im 12/34]
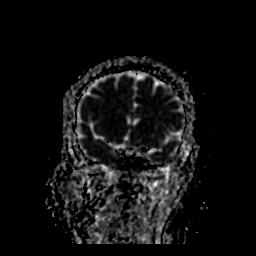
[im 23/34]
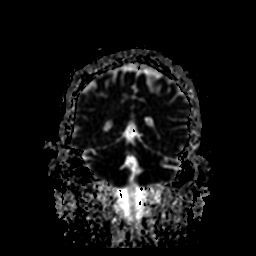
[im 34/34]
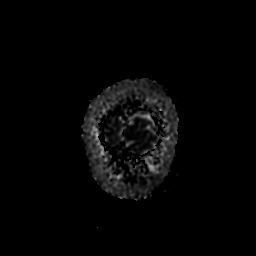

[36 of 48 positions shown; findings below may reference images not displayed]

FINDINGS: Brain: Both hippocampi by are mildly swollen and are T2/FLAIR
hyperintense with DWI hyperintensity (likely combination of shine
through and true restriction based on ADC map). This is consistent
with seizure phenomenon. Encephalitis is not likely given the
asymmetric limited appearance and clinical status. No other finding
to explain a seizure focus. No asymmetric appearance to suggest a
focal underlying mass. Diffusion changes in hippocampi are usually
reversible in seizure; no suspected infarct. No hemorrhage,
hydrocephalus, or shift.

Vascular: Normal flow voids.

Skull and upper cervical spine: Normal marrow signal.

Sinuses/Orbits: Negative.

Other: None.
IMPRESSION: Symmetric signal abnormality in the hippocampi consistent with
seizure phenomenon. Elsewhere negative brain.

## 2018-05-10 ENCOUNTER — Encounter: Payer: Self-pay | Admitting: Occupational Therapy

## 2018-05-10 DIAGNOSIS — I69151 Hemiplegia and hemiparesis following nontraumatic intracerebral hemorrhage affecting right dominant side: Secondary | ICD-10-CM

## 2018-05-10 NOTE — Therapy (Signed)
[facility] [address] [place], [place], [address] Phone: [phone]   Fax:  [phone]  Occupational Therapy Treatment  Patient Details  Name: Geronimo Diliberto MRN: [id number] Date of Birth: 12/10/1980 No data recorded  Encounter Date: 05/10/2018    Past Medical History:  Diagnosis Date  . Current every day smoker   . Polysubstance abuse     No past surgical history on file.  There were no vitals filed for this visit.                          OT Short Term Goals - 01/30/16 1159      OT SHORT TERM GOAL #1   Title  I with HEP for coordination. 02/28/16    Time  6    Period  Weeks    Status  On-going      OT SHORT TERM GOAL #2   Title  Pt/ family will verbalize understanding of compensatory strategies for cognitive/ short term memory deficits.    Time  6    Period  Weeks    Status  On-going      OT SHORT TERM GOAL #3   Title  Pt will perform home management tasks at a modified independent level.    Time  6    Period  Weeks    Status  On-going      OT SHORT TERM GOAL #4   Title  Pt will perform basic cooking with supervision and min v.c.    Time  6    Period  Weeks    Status  On-going      OT SHORT TERM GOAL #5   Title  Pt will perform basic problem solving/ organization tasks with min A.    Time  6    Period  Weeks    Status  On-going        OT Long Term Goals - 01/30/16 1200      OT LONG TERM GOAL #1   Title  Pt will demonstrate improved fine motor coordination as evidenced by decreasing bilateral 9 hole peg test scores by 3 secs. 04/03/16    Time  12    Period  Weeks    Status  On-going      OT LONG TERM GOAL #2   Title  Pt will perform moderate complex financial management tasks with supervision/ min v.c.    Time  12    Period  Weeks    Status  On-going      OT LONG TERM GOAL #3   Title  Pt will perform moderate complex cooking tasks with supervision  demonstrating good safety awareness.    Time  12    Period  Weeks    Status  On-going      OT LONG TERM GOAL #4   Title  Pt will perform  moderate complex organization tasks modified independently.    Time  12    Status  On-going      OT LONG TERM GOAL #5   Title  Pt will utilize compensations for decreased short term memory with no more than min v.c.    Time  12    Period  Weeks    Status  On-going              Patient will benefit from skilled therapeutic intervention in order to improve the following deficits and impairments:  Visit Diagnosis: Hemiplegia and hemiparesis following nontraumatic intracerebral hemorrhage affecting right dominant side [facility])    Problem List Patient Active Problem List   Diagnosis Date Noted  . Chronic daily headache 06/01/2016  . Localization-related symptomatic epilepsy and epileptic syndromes with complex partial seizures, not intractable, without status epilepticus ([place]) 03/02/2016  . Hepatitis C 01/31/2016  . Abnormal LFTs   . Polysubstance abuse ([place]) 01/02/2016  . Cocaine abuse ([place]) 01/02/2016  . Heroin abuse ([place]) 01/02/2016  . Tobacco dependence 01/02/2016  . Elevated liver enzymes 01/02/2016  . Transient amnesia 01/02/2016  . Question of Seizure 01/02/2016  . Leukocytosis 01/02/2016  OCCUPATIONAL THERAPY DISCHARGE SUMMARY  Visits from Start of Care:   Current functional level related to goals / functional outcomes: Unable to assess as pt did not return to therapy after 4th visit   Remaining deficits: See eval   Education / Equipment: See PN  Plan: Patient agrees to discharge.  Patient goals were not met. Patient is being discharged due to not returning since the last visit.  ?????       Quay Burow, OTR/L 05/10/2018, 4:13 PM  [facility] [address] [place], [place], [address] Phone: [phone]   Fax:  [phone]  Name: Norris Brumbach MRN: [id number] Date of Birth: 13-Jan-1981

## 2018-06-29 NOTE — Therapy (Signed)
[facility] [address] [place], [place], [address] Phone: [phone]   Fax:  [phone]  Patient Details  Name: Marco Lynch MRN: [id number] Date of Birth: 01-02-1981 Referring Provider:  Charlott Rakes, MD  Encounter Date: 06/29/2018  SPEECH THERAPY DISCHARGE SUMMARY  Visits from Start of Care: 2  Current functional level related to goals / functional outcomes: Pt progress for his first therapy session was as follows: pt did not return after this visit. Pt was seen for skilled ST following initial evaluation 01/31/16. SLP and patient discussed current typical day with responsibilities at home, including caring for daughter, household chores, etc. Pt identified several areas which could be implemented into therapy, including establishing routines/habits, acquiring a more functional organizational system, use of med box to increase independence with med administration, planning.  SLP Short Term Goals - 02/06/16 1038              SLP SHORT TERM GOAL #1    Title pt will reach to look for information inside memory notebook 100% success in 3 sessions    Time 3    Period Weeks    Status On-going         SLP SHORT TERM GOAL #2    Title in response to SLP question, pt will locate information asked of him in notebook within 10 seconds, 90% of the time over three sessions    Time 3    Period Weeks    Status On-going         SLP SHORT TERM GOAL #3    Title in response to SLP verbal question, pt will tell SLP 4 addtional details of previous event/s since last therapy, using notebook to stimulate his memory    Time 3    Period Weeks    Status On-going         SLP SHORT TERM GOAL #4    Title pt and/or family will tell SLP pt is using memory notebook at home at least three times daily    Time 3    Period Weeks    Status On-going                       SLP Long Term Goals - 02/06/16 [place] #1    Title pt will demo independence with use of memory book in all therapy situations    Time 11    Period Weeks    Status On-going         SLP LONG TERM GOAL #2    Title pt will tell SLP two ways his memory notebook can help him in daily tasks    Time 11    Period Weeks    Status On-going                       Plan - 02/06/16 1030     Clinical Impression Statement Pt was pleasant and cooperative with unfamiliar therapist. Pt freely discussed difficulties he was having at home (misplacing items, forgetting he had just asked about something, difficulty remembering to take meds). Pt discussed wife's variable work schedule, and was encouraged to ask her for a copy of her schedule. He mentioned a plan to retrieve weights and musical equipment from a storage unit in [place] by the end of the year. 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  Pt was given written information regarding problem solving topics discussed this session.       Remaining deficits: Unknown, as pt has not been seen since November 2017.   Education / Equipment: Memory strategies   Plan: Patient agrees to discharge.  Patient goals were not met. Patient is being discharged due to not returning since the last visit.  ?????       [place] ,MS, CCC-SLP  06/29/2018, 2:26 PM  [facility] [address] [place], [place], [address] Phone: [phone]   Fax:  [phone]
